# Patient Record
Sex: Female | Born: 1970 | Race: White | Hispanic: No | Marital: Married | Smoking: Never smoker
Health system: Southern US, Community
[De-identification: ages and names within clinical notes are randomized; demographics above are authoritative.]

## PROBLEM LIST (undated history)

## (undated) DIAGNOSIS — Z87442 Personal history of urinary calculi: Secondary | ICD-10-CM

## (undated) DIAGNOSIS — M1990 Unspecified osteoarthritis, unspecified site: Secondary | ICD-10-CM

## (undated) DIAGNOSIS — R7303 Prediabetes: Secondary | ICD-10-CM

## (undated) DIAGNOSIS — I10 Essential (primary) hypertension: Secondary | ICD-10-CM

## (undated) HISTORY — PX: PILONIDAL CYST EXCISION: SHX744

## (undated) HISTORY — PX: TUMOR EXCISION: SHX421

## (undated) HISTORY — PX: CHOLECYSTECTOMY: SHX55

## (undated) HISTORY — DX: Morbid (severe) obesity due to excess calories: E66.01

## (undated) HISTORY — DX: Prediabetes: R73.03

## (undated) HISTORY — PX: CYST EXCISION: SHX5701

---

## 2008-05-01 ENCOUNTER — Ambulatory Visit: Payer: Self-pay | Admitting: Family Medicine

## 2011-06-28 ENCOUNTER — Ambulatory Visit: Payer: Self-pay | Admitting: Internal Medicine

## 2019-03-11 ENCOUNTER — Encounter: Payer: Self-pay | Admitting: *Deleted

## 2019-03-11 ENCOUNTER — Other Ambulatory Visit: Payer: Self-pay

## 2019-03-11 ENCOUNTER — Encounter
Admission: RE | Admit: 2019-03-11 | Discharge: 2019-03-11 | Disposition: A | Discharge status: Home / Self Care | Payer: BLUE CROSS/BLUE SHIELD | Source: Ambulatory Visit | Attending: Orthopedic Surgery | Admitting: Orthopedic Surgery

## 2019-03-11 HISTORY — DX: Essential (primary) hypertension: I10

## 2019-03-11 HISTORY — DX: Unspecified osteoarthritis, unspecified site: M19.90

## 2019-03-11 HISTORY — DX: Personal history of urinary calculi: Z87.442

## 2019-03-11 NOTE — Patient Instructions (Signed)
Your procedure is scheduled on: 03-14-19 Report to Same Day Surgery 2nd floor medical mall [facility] Entrance-take elevator on left to 2nd floor.  Check in with surgery information desk.) To find out your arrival time please call [phone] between 1PM - 3PM on 03-11-19  Remember: Instructions that are not followed completely may result in serious medical risk, up to and including death, or upon the discretion of your surgeon and anesthesiologist your surgery may need to be rescheduled.    _x___ 1. Do not eat food after midnight the night before your procedure. You may drink clear liquids up to 2 hours before you are scheduled to arrive at the hospital for your procedure.  Do not drink clear liquids within 2 hours of your scheduled arrival to the hospital.  Clear liquids include  --Water or Apple juice without pulp  --Clear carbohydrate beverage such as ClearFast or Gatorade  --Black Coffee or Clear Tea (No milk, no creamers, do not add anything to the coffee or Tea   ____Ensure clear carbohydrate drink on the way to the hospital for bariatric patients  ____Ensure clear carbohydrate drink 3 hours before surgery for Dr Rutherford Nail patients if physician instructed.   No gum chewing or hard candies.     __x__ 2. No Alcohol for 24 hours before or after surgery.   __x__3. No Smoking or e-cigarettes for 24 prior to surgery.  Do not use any chewable tobacco products for at least 6 hour prior to surgery   ____  4. Bring all medications with you on the day of surgery if instructed.    __x__ 5. Notify your doctor if there is any change in your medical condition     (cold, fever, infections).    x___6. On the morning of surgery brush your teeth with toothpaste and water.  You may rinse your mouth with mouth wash if you wish.  Do not swallow any toothpaste or mouthwash.   Do not wear jewelry, make-up, hairpins, clips or nail polish.  Do not wear lotions, powders, or perfumes. You may wear  deodorant.  Do not shave 48 hours prior to surgery. Men may shave face and neck.  Do not bring valuables to the hospital.    [facility] is not responsible for any belongings or valuables.               Contacts, dentures or bridgework may not be worn into surgery.  Leave your suitcase in the car. After surgery it may be brought to your room.  For patients admitted to the hospital, discharge time is determined by your treatment team.  _  Patients discharged the day of surgery will not be allowed to drive home.  You will need someone to drive you home and stay with you the night of your procedure.    Please read over the following fact sheets that you were given:   [facility] Preparing for Surgery   ____ Take anti-hypertensive listed below, cardiac, seizure, asthma, anti-reflux and psychiatric medicines. These include:  1. NONE  2.  3.  4.  5.  6.  ____Fleets enema or Magnesium Citrate as directed.   _x___ Use CHG Soap or sage wipes as directed on instruction sheet   ____ Use inhalers on the day of surgery and bring to hospital day of surgery  ____ Stop Metformin and Janumet 2 days prior to surgery.    ____ Take 1/2 of usual insulin dose the night before surgery and none on  the morning surgery.   ____ Follow recommendations from Cardiologist, Pulmonologist or PCP regarding stopping Aspirin, Coumadin, Plavix ,Eliquis, Effient, or Pradaxa, and Pletal.  X____Stop Anti-inflammatories such as Advil, Aleve, Ibuprofen, Motrin, Naproxen, Naprosyn, Goodies powders or aspirin products NOW-OK to take Tylenol   ____ Stop supplements until after surgery.     ____ Bring C-Pap to the hospital.

## 2019-03-13 MED ORDER — DEXTROSE 5 % IV SOLN
3.0000 g | Freq: Once | INTRAVENOUS | Status: AC
  Administered 2019-03-14: 3 g via INTRAVENOUS
  Filled 2019-03-13: qty 3000

## 2019-03-14 ENCOUNTER — Ambulatory Visit: Payer: BLUE CROSS/BLUE SHIELD | Admitting: Certified Registered"

## 2019-03-14 ENCOUNTER — Encounter
Admission: RE | Disposition: A | Discharge status: Home / Self Care | Payer: Self-pay | Source: Home / Self Care | Attending: Orthopedic Surgery

## 2019-03-14 ENCOUNTER — Encounter: Payer: Self-pay | Admitting: *Deleted

## 2019-03-14 ENCOUNTER — Ambulatory Visit
Admission: RE | Admit: 2019-03-14 | Discharge: 2019-03-14 | Disposition: A | Discharge status: Home / Self Care | Payer: BLUE CROSS/BLUE SHIELD | Attending: Orthopedic Surgery | Admitting: Orthopedic Surgery

## 2019-03-14 ENCOUNTER — Other Ambulatory Visit: Payer: Self-pay

## 2019-03-14 DIAGNOSIS — M23204 Derangement of unspecified medial meniscus due to old tear or injury, left knee: Secondary | ICD-10-CM | POA: Insufficient documentation

## 2019-03-14 DIAGNOSIS — Z975 Presence of (intrauterine) contraceptive device: Secondary | ICD-10-CM | POA: Insufficient documentation

## 2019-03-14 DIAGNOSIS — I10 Essential (primary) hypertension: Secondary | ICD-10-CM | POA: Diagnosis not present

## 2019-03-14 DIAGNOSIS — M1712 Unilateral primary osteoarthritis, left knee: Secondary | ICD-10-CM | POA: Diagnosis not present

## 2019-03-14 DIAGNOSIS — Z6841 Body Mass Index (BMI) 40.0 and over, adult: Secondary | ICD-10-CM | POA: Diagnosis not present

## 2019-03-14 DIAGNOSIS — Z79899 Other long term (current) drug therapy: Secondary | ICD-10-CM | POA: Diagnosis not present

## 2019-03-14 DIAGNOSIS — Z888 Allergy status to other drugs, medicaments and biological substances status: Secondary | ICD-10-CM | POA: Diagnosis not present

## 2019-03-14 HISTORY — PX: KNEE ARTHROSCOPY WITH MEDIAL MENISECTOMY: SHX5651

## 2019-03-14 LAB — BASIC METABOLIC PANEL
Anion gap: 12 (ref 5–15)
BUN: 14 mg/dL (ref 6–20)
CO2: 24 mmol/L (ref 22–32)
Calcium: 9.3 mg/dL (ref 8.9–10.3)
Chloride: 100 mmol/L (ref 98–111)
Creatinine, Ser: 0.49 mg/dL (ref 0.44–1.00)
GFR calc Af Amer: >60 mL/min (ref >60)
GFR calc non Af Amer: >60 mL/min (ref >60)
Glucose, Bld: 111 mg/dL — ABNORMAL HIGH (ref 70–99)
Potassium: 3.6 mmol/L (ref 3.5–5.1)
Sodium: 136 mmol/L (ref 135–145)

## 2019-03-14 LAB — POCT PREGNANCY, URINE: Preg Test, Ur: NEGATIVE

## 2019-03-14 SURGERY — ARTHROSCOPY, KNEE, WITH MEDIAL MENISCECTOMY
Anesthesia: General | Laterality: Left

## 2019-03-14 MED ORDER — FAMOTIDINE 20 MG PO TABS
20.0000 mg | ORAL_TABLET | Freq: Once | ORAL | Status: AC
  Administered 2019-03-14: 20 mg via ORAL

## 2019-03-14 MED ORDER — FENTANYL CITRATE (PF) 100 MCG/2ML IJ SOLN
INTRAMUSCULAR | Status: AC
  Administered 2019-03-14: 50 ug via INTRAVENOUS
  Filled 2019-03-14: qty 2

## 2019-03-14 MED ORDER — PROMETHAZINE HCL 25 MG/ML IJ SOLN: 6.2500 mg | INTRAMUSCULAR | Status: DC | PRN

## 2019-03-14 MED ORDER — EPINEPHRINE 30 MG/30ML IJ SOLN
INTRAMUSCULAR | Status: AC
  Filled 2019-03-14: qty 1

## 2019-03-14 MED ORDER — ACETAMINOPHEN 500 MG PO TABS: 1000.0000 mg | ORAL_TABLET | Freq: Three times a day (TID) | ORAL | 2 refills | Status: AC

## 2019-03-14 MED ORDER — ONDANSETRON HCL 4 MG/2ML IJ SOLN
INTRAMUSCULAR | Status: DC | PRN
  Administered 2019-03-14: 4 mg via INTRAVENOUS

## 2019-03-14 MED ORDER — ROCURONIUM BROMIDE 50 MG/5ML IV SOLN
INTRAVENOUS | Status: AC
  Filled 2019-03-14: qty 1

## 2019-03-14 MED ORDER — OXYCODONE HCL 5 MG PO TABS
ORAL_TABLET | ORAL | Status: AC
  Administered 2019-03-14: 5 mg via ORAL
  Filled 2019-03-14: qty 1

## 2019-03-14 MED ORDER — ASPIRIN EC 325 MG PO TBEC: 325.0000 mg | DELAYED_RELEASE_TABLET | Freq: Every day | ORAL | 0 refills | Status: AC

## 2019-03-14 MED ORDER — MIDAZOLAM HCL 2 MG/2ML IJ SOLN
INTRAMUSCULAR | Status: DC | PRN
  Administered 2019-03-14: 2 mg via INTRAVENOUS

## 2019-03-14 MED ORDER — FENTANYL CITRATE (PF) 250 MCG/5ML IJ SOLN
INTRAMUSCULAR | Status: AC
  Filled 2019-03-14: qty 5

## 2019-03-14 MED ORDER — DEXAMETHASONE SODIUM PHOSPHATE 10 MG/ML IJ SOLN
INTRAMUSCULAR | Status: DC | PRN
  Administered 2019-03-14: 10 mg via INTRAVENOUS

## 2019-03-14 MED ORDER — HYDROCODONE-ACETAMINOPHEN 5-325 MG PO TABS: 1.0000 | ORAL_TABLET | ORAL | 0 refills | Status: DC | PRN

## 2019-03-14 MED ORDER — PROPOFOL 10 MG/ML IV BOLUS
INTRAVENOUS | Status: AC
  Filled 2019-03-14: qty 40

## 2019-03-14 MED ORDER — LIDOCAINE-EPINEPHRINE (PF) 1 %-1:200000 IJ SOLN
INTRAMUSCULAR | Status: AC
  Filled 2019-03-14: qty 30

## 2019-03-14 MED ORDER — FENTANYL CITRATE (PF) 100 MCG/2ML IJ SOLN
INTRAMUSCULAR | Status: DC | PRN
  Administered 2019-03-14 (×3): 50 ug via INTRAVENOUS
  Administered 2019-03-14: 100 ug via INTRAVENOUS

## 2019-03-14 MED ORDER — FENTANYL CITRATE (PF) 100 MCG/2ML IJ SOLN
INTRAMUSCULAR | Status: AC
  Administered 2019-03-14: 25 ug via INTRAVENOUS
  Filled 2019-03-14: qty 2

## 2019-03-14 MED ORDER — FENTANYL CITRATE (PF) 100 MCG/2ML IJ SOLN
25.0000 ug | INTRAMUSCULAR | Status: DC | PRN
  Administered 2019-03-14 (×2): 25 ug via INTRAVENOUS
  Administered 2019-03-14: 50 ug via INTRAVENOUS
  Administered 2019-03-14 (×2): 25 ug via INTRAVENOUS

## 2019-03-14 MED ORDER — OXYCODONE HCL 5 MG/5ML PO SOLN: 5.0000 mg | Freq: Once | ORAL | Status: AC | PRN

## 2019-03-14 MED ORDER — ONDANSETRON HCL 4 MG/2ML IJ SOLN
INTRAMUSCULAR | Status: AC
  Filled 2019-03-14: qty 2

## 2019-03-14 MED ORDER — BUPIVACAINE HCL (PF) 0.5 % IJ SOLN
INTRAMUSCULAR | Status: AC
  Filled 2019-03-14: qty 30

## 2019-03-14 MED ORDER — DEXAMETHASONE SODIUM PHOSPHATE 10 MG/ML IJ SOLN
INTRAMUSCULAR | Status: AC
  Filled 2019-03-14: qty 1

## 2019-03-14 MED ORDER — IBUPROFEN 800 MG PO TABS: 800.0000 mg | ORAL_TABLET | Freq: Three times a day (TID) | ORAL | 0 refills | Status: AC

## 2019-03-14 MED ORDER — DEXMEDETOMIDINE HCL 200 MCG/2ML IV SOLN
INTRAVENOUS | Status: DC | PRN
  Administered 2019-03-14: 12 ug via INTRAVENOUS
  Administered 2019-03-14: 8 ug via INTRAVENOUS

## 2019-03-14 MED ORDER — LIDOCAINE HCL (PF) 2 % IJ SOLN
INTRAMUSCULAR | Status: AC
  Filled 2019-03-14: qty 10

## 2019-03-14 MED ORDER — PROPOFOL 10 MG/ML IV BOLUS
INTRAVENOUS | Status: DC | PRN
  Administered 2019-03-14: 200 mg via INTRAVENOUS

## 2019-03-14 MED ORDER — LACTATED RINGERS IV SOLN
INTRAVENOUS | Status: DC | PRN
  Administered 2019-03-14: 6000 mL

## 2019-03-14 MED ORDER — OXYCODONE HCL 5 MG PO TABS
5.0000 mg | ORAL_TABLET | Freq: Once | ORAL | Status: AC | PRN
  Administered 2019-03-14: 5 mg via ORAL

## 2019-03-14 MED ORDER — SUCCINYLCHOLINE CHLORIDE 20 MG/ML IJ SOLN
INTRAMUSCULAR | Status: AC
  Filled 2019-03-14: qty 1

## 2019-03-14 MED ORDER — LACTATED RINGERS IV SOLN
INTRAVENOUS | Status: DC
  Administered 2019-03-14 (×2): via INTRAVENOUS

## 2019-03-14 MED ORDER — SUCCINYLCHOLINE CHLORIDE 20 MG/ML IJ SOLN
INTRAMUSCULAR | Status: DC | PRN
  Administered 2019-03-14: 180 mg via INTRAVENOUS

## 2019-03-14 MED ORDER — LIDOCAINE HCL (CARDIAC) PF 100 MG/5ML IV SOSY
PREFILLED_SYRINGE | INTRAVENOUS | Status: DC | PRN
  Administered 2019-03-14: 100 mg via INTRAVENOUS

## 2019-03-14 MED ORDER — MEPERIDINE HCL 50 MG/ML IJ SOLN: 6.2500 mg | INTRAMUSCULAR | Status: DC | PRN

## 2019-03-14 MED ORDER — SUGAMMADEX SODIUM 500 MG/5ML IV SOLN
INTRAVENOUS | Status: DC | PRN
  Administered 2019-03-14: 400 mg via INTRAVENOUS

## 2019-03-14 MED ORDER — ROCURONIUM BROMIDE 100 MG/10ML IV SOLN
INTRAVENOUS | Status: DC | PRN
  Administered 2019-03-14: 10 mg via INTRAVENOUS
  Administered 2019-03-14: 40 mg via INTRAVENOUS

## 2019-03-14 MED ORDER — SUGAMMADEX SODIUM 500 MG/5ML IV SOLN
INTRAVENOUS | Status: AC
  Filled 2019-03-14: qty 5

## 2019-03-14 MED ORDER — FAMOTIDINE 20 MG PO TABS
ORAL_TABLET | ORAL | Status: AC
  Filled 2019-03-14: qty 1

## 2019-03-14 MED ORDER — ONDANSETRON 4 MG PO TBDP: 4.0000 mg | ORAL_TABLET | Freq: Three times a day (TID) | ORAL | 0 refills | Status: DC | PRN

## 2019-03-14 MED ORDER — MIDAZOLAM HCL 2 MG/2ML IJ SOLN
INTRAMUSCULAR | Status: AC
  Filled 2019-03-14: qty 2

## 2019-03-14 SURGICAL SUPPLY — 56 items
ADAPTER IRRIG TUBE 2 SPIKE SOL (ADAPTER) ×6 IMPLANT
BANDAGE ACE 6X5 VEL STRL LF (GAUZE/BANDAGES/DRESSINGS) ×3 IMPLANT
BLADE SURG SZ11 CARB STEEL (BLADE) ×3 IMPLANT
BNDG COHESIVE 6X5 TAN STRL LF (GAUZE/BANDAGES/DRESSINGS) ×3 IMPLANT
BNDG ESMARK 6X12 TAN STRL LF (GAUZE/BANDAGES/DRESSINGS) ×3 IMPLANT
BUR RADIUS 3.5 (BURR) ×3 IMPLANT
BUR RADIUS 4.0X18.5 (BURR) ×3 IMPLANT
CAST PADDING 6X4YD ST 30248 (SOFTGOODS) ×2
CHLORAPREP W/TINT 26 (MISCELLANEOUS) ×3 IMPLANT
CLOSURE WOUND 1/2 X4 (GAUZE/BANDAGES/DRESSINGS)
COOLER POLAR GLACIER W/PUMP (MISCELLANEOUS) ×3 IMPLANT
COVER WAND RF STERILE (DRAPES) ×3 IMPLANT
CUFF TOURN SGL QUICK 24 (TOURNIQUET CUFF)
CUFF TOURN SGL QUICK 30 (TOURNIQUET CUFF)
CUFF TRNQT CYL 24X4X16.5-23 (TOURNIQUET CUFF) IMPLANT
CUFF TRNQT CYL 30X4X21-28X (TOURNIQUET CUFF) IMPLANT
DEVICE SUCT BLK HOLE OR FLOOR (MISCELLANEOUS) ×3 IMPLANT
DRAPE IMP U-DRAPE 54X76 (DRAPES) ×3 IMPLANT
DRAPE LEGGINS SURG 28X43 STRL (DRAPES) IMPLANT
ELECT REM PT RETURN 9FT ADLT (ELECTROSURGICAL)
ELECTRODE REM PT RTRN 9FT ADLT (ELECTROSURGICAL) IMPLANT
GAUZE SPONGE 4X4 12PLY STRL (GAUZE/BANDAGES/DRESSINGS) ×3 IMPLANT
GLOVE BIOGEL PI IND STRL 8 (GLOVE) ×1 IMPLANT
GLOVE BIOGEL PI INDICATOR 8 (GLOVE) ×2
GLOVE SURG ORTHO 8.0 STRL STRW (GLOVE) ×6 IMPLANT
GOWN STRL REUS W/ TWL LRG LVL3 (GOWN DISPOSABLE) ×1 IMPLANT
GOWN STRL REUS W/ TWL XL LVL3 (GOWN DISPOSABLE) ×1 IMPLANT
GOWN STRL REUS W/TWL LRG LVL3 (GOWN DISPOSABLE) ×2
GOWN STRL REUS W/TWL XL LVL3 (GOWN DISPOSABLE) ×2
IV LACTATED RINGER IRRG 3000ML (IV SOLUTION) ×8
IV LR IRRIG 3000ML ARTHROMATIC (IV SOLUTION) ×4 IMPLANT
KIT TURNOVER KIT A (KITS) ×3 IMPLANT
MANIFOLD NEPTUNE II (INSTRUMENTS) ×3 IMPLANT
MAT ABSORB  FLUID 56X50 GRAY (MISCELLANEOUS) ×4
MAT ABSORB FLUID 56X50 GRAY (MISCELLANEOUS) ×2 IMPLANT
NDL MAYO CATGUT SZ5 (NEEDLE)
NDL SUT 5 .5 CRC TPR PNT MAYO (NEEDLE) IMPLANT
NEEDLE HYPO 22GX1.5 SAFETY (NEEDLE) ×3 IMPLANT
PACK ARTHROSCOPY KNEE (MISCELLANEOUS) ×3 IMPLANT
PAD ABD DERMACEA PRESS 5X9 (GAUZE/BANDAGES/DRESSINGS) ×6 IMPLANT
PAD WRAPON POLAR KNEE (MISCELLANEOUS) ×1 IMPLANT
PADDING CAST COTTON 6X4 ST (SOFTGOODS) ×1 IMPLANT
PENCIL ELECTRO HAND CTR (MISCELLANEOUS) IMPLANT
SET TUBE SUCT SHAVER OUTFL 24K (TUBING) ×3 IMPLANT
SET TUBE TIP INTRA-ARTICULAR (MISCELLANEOUS) ×3 IMPLANT
STRIP CLOSURE SKIN 1/2X4 (GAUZE/BANDAGES/DRESSINGS) IMPLANT
SUT ETHILON 3-0 FS-10 30 BLK (SUTURE) ×3
SUT MNCRL AB 4-0 PS2 18 (SUTURE) IMPLANT
SUT VIC AB 0 CT2 27 (SUTURE) IMPLANT
SUT VIC AB 2-0 CT2 27 (SUTURE) IMPLANT
SUTURE EHLN 3-0 FS-10 30 BLK (SUTURE) ×1 IMPLANT
TOWEL OR 17X26 4PK STRL BLUE (TOWEL DISPOSABLE) ×6 IMPLANT
TUBING ARTHRO INFLOW-ONLY STRL (TUBING) ×3 IMPLANT
WAND HAND CNTRL MULTIVAC 50 (MISCELLANEOUS) IMPLANT
WAND WEREWOLF FLOW 90D (MISCELLANEOUS) ×3 IMPLANT
WRAPON POLAR PAD KNEE (MISCELLANEOUS) ×3

## 2019-03-14 NOTE — Transfer of Care (Signed)
Immediate Anesthesia Transfer of Care Note  Patient: Sierra Mitchell  Procedure(s) Performed: KNEE ARTHROSCOPY WITH PATIAL MEDIAL MENISECTOMY  LEFT (Left )  Patient Location: PACU  Anesthesia Type:General  Level of Consciousness: drowsy  Airway & Oxygen Therapy: Patient Spontanous Breathing and Patient connected to nasal cannula oxygen  Post-op Assessment: Report given to RN and Post -op Vital signs reviewed and stable  Post vital signs: stable  Last Vitals:  Vitals Value Taken Time  BP 175/106 03/14/2019  2:34 PM  Temp 36.4 C 03/14/2019  2:34 PM  Pulse 111 03/14/2019  2:39 PM  Resp 15 03/14/2019  2:39 PM  SpO2 98 % 03/14/2019  2:39 PM  Vitals shown include unvalidated device data.  Last Pain:  Vitals:   03/14/19 1016  TempSrc: Tympanic  PainSc: 6          Complications: No apparent anesthesia complications

## 2019-03-14 NOTE — Anesthesia Preprocedure Evaluation (Signed)
Anesthesia Evaluation  Patient identified by MRN, date of birth, ID band Patient awake    Reviewed: Allergy & Precautions, NPO status , Patient's Chart, lab work & pertinent test results  History of Anesthesia Complications Negative for: history of anesthetic complications  Airway Mallampati: III  TM Distance: >3 FB Neck ROM: Full    Dental no notable dental hx.    Pulmonary neg pulmonary ROS, neg sleep apnea, neg COPD,    breath sounds clear to auscultation- rhonchi (-) wheezing      Cardiovascular hypertension, Pt. on medications (-) CAD, (-) Past MI, (-) Cardiac Stents and (-) CABG  Rhythm:Regular Rate:Normal - Systolic murmurs and - Diastolic murmurs    Neuro/Psych neg Seizures negative neurological ROS  negative psych ROS   GI/Hepatic negative GI ROS, Neg liver ROS,   Endo/Other  negative endocrine ROSneg diabetes  Renal/GU negative Renal ROS     Musculoskeletal  (+) Arthritis ,   Abdominal (+) + obese,   Peds  Hematology negative hematology ROS (+)   Anesthesia Other Findings Past Medical History: No date: Arthritis No date: History of kidney stones     Comment:  h/o No date: Hypertension   Reproductive/Obstetrics                             Anesthesia Physical Anesthesia Plan  ASA: III  Anesthesia Plan: General   Post-op Pain Management:    Induction: Intravenous  PONV Risk Score and Plan: 2 and Ondansetron and Midazolam  Airway Management Planned: Oral ETT  Additional Equipment:   Intra-op Plan:   Post-operative Plan: Extubation in OR  Informed Consent: I have reviewed the patients History and Physical, chart, labs and discussed the procedure including the risks, benefits and alternatives for the proposed anesthesia with the patient or authorized representative who has indicated his/her understanding and acceptance.     Dental advisory given  Plan Discussed  with: CRNA and Anesthesiologist  Anesthesia Plan Comments:         Anesthesia Quick Evaluation

## 2019-03-14 NOTE — Anesthesia Postprocedure Evaluation (Signed)
Anesthesia Post Note  Patient: Sierra Mitchell  Procedure(s) Performed: KNEE ARTHROSCOPY WITH PATIAL MEDIAL MENISECTOMY  LEFT (Left )  Patient location during evaluation: PACU Anesthesia Type: General Level of consciousness: awake and alert and oriented Pain management: pain level controlled Vital Signs Assessment: post-procedure vital signs reviewed and stable Respiratory status: spontaneous breathing, nonlabored ventilation and respiratory function stable Cardiovascular status: blood pressure returned to baseline and stable Postop Assessment: no signs of nausea or vomiting Anesthetic complications: no     Last Vitals:  Vitals:   03/14/19 1445 03/14/19 1448  BP:  (!) 148/87  Pulse: (!) 106   Resp: 15   Temp:    SpO2: 92% 97%    Last Pain:  Vitals:   03/14/19 1448  TempSrc:   PainSc: 5                  Nakaiya Beddow

## 2019-03-14 NOTE — Anesthesia Post-op Follow-up Note (Signed)
Anesthesia QCDR form completed.        

## 2019-03-14 NOTE — Op Note (Addendum)
Operative Note    SURGERY DATE: 03/14/2019   PRE-OP DIAGNOSIS:  1. Left medial meniscus tear   POST-OP DIAGNOSIS:  1. Left medial meniscus tear 2. Left medial femoral condyle degenerative changes   PROCEDURES:  1. Left knee arthroscopy, partial medial meniscectomy   SURGEON: Rosealee Albee, MD   ANESTHESIA: Gen   ESTIMATED BLOOD LOSS: minimal   TOTAL IV FLUIDS: per anesthesia   INDICATION(S):  Sierra Mitchell is a 48 y.o. female with signs and symptoms as well as MRI finding of medial meniscus tear. We had a lengthy conversation regarding her weight and morbid obesity, and that she was at significantly higher perioperative risk as well as postoperative difficulty. After discussion of risks, benefits, and alternatives to surgery, the patient elected to proceed.   OPERATIVE FINDINGS:    Examination under anesthesia: A careful examination under anesthesia was performed.  Passive range of motion was: Hyperextension: 1.  Extension: 0.  Flexion: 110.  Lachman: normal. Pivot Shift: normal.  Posterior drawer: normal.  Varus stability in full extension: normal.  Varus stability in 30 degrees of flexion: normal.  Valgus stability in full extension: normal.  Valgus stability in 30 degrees of flexion: normal.   Intra-operative findings: A thorough arthroscopic examination of the knee was performed.  The findings are: 1. Suprapatellar pouch: Normal 2. Undersurface of median ridge: Grade 1 softening 3. Medial patellar facet: Grade 1 degenerative changes 4. Lateral patellar facet: Grade 1 degenerative changes 5. Trochlea: Normal 6. Lateral gutter/popliteus tendon: Normal 7. Hoffa's fat pad: Inflamed 8. Medial gutter/plica: Normal 9. ACL: Normal 10. PCL: Normal 11. Medial meniscus: Radial/parrot beak type tear of the posterior horn of the medial meniscus affecting ~40% of meniscus width.  12. Medial compartment cartilage: Diffuse Grade 1-2 degenerative changes 13. Lateral meniscus: Normal 14.  Lateral compartment cartilage: Normal   OPERATIVE REPORT:     I identified Dorris Riehle in the pre-operative holding area. I marked the operative knee with my initials. I reviewed the risks and benefits of the proposed surgical intervention and the patient (and/or patient's guardian) wished to proceed. The patient was transferred to the operative suite and placed in the supine position with all bony prominences padded.  Anesthesia was administered. Appropriate IV antibiotics were administered prior to incision. The extremity was then prepped and draped in standard fashion. A time out was performed confirming the correct extremity, correct patient, and correct procedure.   Arthroscopy portals were marked. Local anesthetic was injected to the planned portal sites. The anterolateral portal was established with an 11 blade.      The arthroscope was placed in the anterolateral portal and then into the suprapatellar pouch.  A diagnostic knee scope was completed with the above findings. The medial meniscus tear was identified.   Next the medial portal was established under needle localization. The MCL was pie-crusted to improve visualization of the posterior horn. The meniscal tear was debrided using an arthroscopic biter and an oscillating shaver until the meniscus had stable borders.  Arthroscopic fluid was removed from the joint.   The portals were closed with 3-0 Nylon suture. Sterile dressings included Xeroform, 4x4s, Sof-Rol, and Bias wrap. A Polarcare was placed.  The patient was then awakened and taken to the PACU hemodynamically stable without complication.   Of note, this case required increased surgical difficulty and complexity.  This was primarily due to the patient's morbid obesity with a BMI of 67.  This increased surgical time due to difficulty with identifying appropriate bony  landmarks and creating normal arthroscopic portals.  Additionally, additional assistance was needed from the scrub  tech with positioning of the leg due to the patient's weight.   POSTOPERATIVE PLAN: The patient will be discharged home today once they meet PACU criteria. Aspirin 325 mg daily was prescribed for 2 weeks for DVT prophylaxis.  Physical therapy will start on POD#3-4. Weight-bearing as tolerated. Follow up in 2 weeks per protocol.

## 2019-03-14 NOTE — H&P (Signed)
Paper H&P to be scanned into permanent record. H&P reviewed. No significant changes noted.  

## 2019-03-14 NOTE — Anesthesia Procedure Notes (Signed)
Procedure Name: Intubation Date/Time: 03/14/2019 1:14 PM Performed by: Oletta Lamas, CRNA Pre-anesthesia Checklist: Patient identified, Emergency Drugs available, Suction available, Patient being monitored and Timeout performed Patient Re-evaluated:Patient Re-evaluated prior to induction Oxygen Delivery Method: Circle system utilized Preoxygenation: Pre-oxygenation with 100% oxygen Induction Type: IV induction Ventilation: Mask ventilation without difficulty Laryngoscope Size: Glidescope and 3 Grade View: Grade I Tube type: Oral Tube size: 7.5 mm Number of attempts: 1 Airway Equipment and Method: Patient positioned with wedge pillow and Stylet Placement Confirmation: ETT inserted through vocal cords under direct vision,  positive ETCO2 and breath sounds checked- equal and bilateral Secured at: 21 (Atv teeth) cm Tube secured with: Tape Dental Injury: Teeth and Oropharynx as per pre-operative assessment

## 2019-03-14 NOTE — Discharge Instructions (Addendum)
Arthroscopic Knee Surgery - Partial Meniscectomy   Post-Op Instructions   1. Bracing or crutches: Crutches will be provided at the time of discharge from the surgery center if you do not already have them.   2. Ice: You may be provided with a device (Polar Care) that allows you to ice the affected area effectively. Otherwise you can ice manually.    3. Driving:  Plan on not driving for at least two weeks. Please note that you are advised NOT to drive while taking narcotic pain medications as you may be impaired and unsafe to drive.   4. Activity: Ankle pumps several times an hour while awake to prevent blood clots. Weight bearing: as tolerated. Use crutches for as needed (usually ~1 week or less) until pain allows you to ambulate without a limp. Bending and straightening the knee is unlimited. Elevate knee above heart level as much as possible for one week. Avoid standing more than 5 minutes (consecutively) for the first week.  Avoid long distance travel for 2 weeks.  5. Medications:  - You have been provided a prescription for narcotic pain medicine. After surgery, take 1-2 narcotic tablets every 4 hours if needed for severe pain.  - You may take up to 3000mg/day of tylenol (acetaminophen). You can take 1000mg 3x/day. Please check your narcotic. If you have acetaminophen in your narcotic (each tablet will be 325mg), be careful not to exceed a total of 3000mg/day of acetaminophen.  - A prescription for anti-nausea medication will be provided in case the narcotic medicine or anesthesia causes nausea - take 1 tablet every 6 hours only if nauseated.  - Take ibuprofen 800 mg every 8 hours WITH food to reduce post-operative knee swelling. DO NOT STOP IBUPROFEN POST-OP UNTIL INSTRUCTED TO DO SO at first post-op office visit (10-14 days after surgery). However, please discontinue if you have any abdominal discomfort after taking this.  - Take enteric coated aspirin 325 mg once daily for 2 weeks to prevent  blood clots.    6. Bandages: The physical therapist should change the bandages at the first post-op appointment. If needed, the dressing supplies have been provided to you.   7. Physical Therapy: 1-2 times per week for 6 weeks. Therapy typically starts on post operative Day 3 or 4. You have been provided an order for physical therapy. The therapist will provide home exercises.   8. Work: May return to full work usually around 2 weeks after 1st post-operative visit. May do light duty/desk job in approximately 1-2 weeks when off of narcotics, pain is well-controlled, and swelling has decreased. Labor intensive jobs may require 4-6 weeks to return.      9. Post-Op Appointments: Your first post-op appointment will be with Dr. Patel in approximately 2 weeks time.    If you find that they have not been scheduled please call the Orthopaedic Appointment front desk at [phone].  AMBULATORY SURGERY  DISCHARGE INSTRUCTIONS   1) The drugs that you were given will stay in your system until tomorrow so for the next 24 hours you should not:  A) Drive an automobile B) Make any legal decisions C) Drink any alcoholic beverage   2) You may resume regular meals tomorrow.  Today it is better to start with liquids and gradually work up to solid foods.  You may eat anything you prefer, but it is better to start with liquids, then soup and crackers, and gradually work up to solid foods.   3) Please notify your   doctor immediately if you have any unusual bleeding, trouble breathing, redness and pain at the surgery site, drainage, fever, or pain not relieved by medication.    4) Additional Instructions:  AMBULATORY SURGERY  DISCHARGE INSTRUCTIONS   5) The drugs that you were given will stay in your system until tomorrow so for the next 24 hours you should not:  D) Drive an automobile E) Make any legal decisions F) Drink any alcoholic beverage   6) You may resume regular meals tomorrow.   Today it is better to start with liquids and gradually work up to solid foods.  You may eat anything you prefer, but it is better to start with liquids, then soup and crackers, and gradually work up to solid foods.   7) Please notify your doctor immediately if you have any unusual bleeding, trouble breathing, redness and pain at the surgery site, drainage, fever, or pain not relieved by medication.    8) Additional Instructions:    Please contact your physician with any problems or Same Day Surgery at [phone], Monday through Friday 6 am to 4 pm, or [place] at [facility] number at [phone]. Please contact your physician with any problems or Same Day Surgery at [phone], Monday through Friday 6 am to 4 pm, or [place] at [facility] number at [phone].

## 2019-03-15 ENCOUNTER — Encounter: Payer: Self-pay | Admitting: Orthopedic Surgery

## 2020-08-07 ENCOUNTER — Other Ambulatory Visit: Payer: Self-pay | Admitting: Orthopedic Surgery

## 2020-08-07 DIAGNOSIS — M1711 Unilateral primary osteoarthritis, right knee: Secondary | ICD-10-CM

## 2020-08-07 DIAGNOSIS — M2391 Unspecified internal derangement of right knee: Secondary | ICD-10-CM

## 2020-08-07 DIAGNOSIS — M25561 Pain in right knee: Secondary | ICD-10-CM

## 2020-08-28 ENCOUNTER — Other Ambulatory Visit: Payer: Self-pay | Admitting: Orthopedic Surgery

## 2020-08-31 ENCOUNTER — Ambulatory Visit
Admission: RE | Admit: 2020-08-31 | Discharge: 2020-08-31 | Disposition: A | Discharge status: Home / Self Care | Payer: Commercial Managed Care - PPO | Source: Ambulatory Visit | Attending: Orthopedic Surgery | Admitting: Orthopedic Surgery

## 2020-08-31 DIAGNOSIS — M25561 Pain in right knee: Secondary | ICD-10-CM

## 2020-08-31 DIAGNOSIS — M2391 Unspecified internal derangement of right knee: Secondary | ICD-10-CM

## 2020-08-31 DIAGNOSIS — M1711 Unilateral primary osteoarthritis, right knee: Secondary | ICD-10-CM

## 2022-03-01 IMAGING — MR MR KNEE*R* W/O CM
6 series · 40 of 40 positions shown · non-contrast
Comparison: None.

CLINICAL DATA: Acute right knee pain. Osteoarthritis. History of
left knee arthroscopy and partial meniscectomy.

EXAM:
MRI OF THE RIGHT KNEE WITHOUT CONTRAST
TECHNIQUE: Multiplanar, multisequence MR imaging of the knee was performed. No
intravenous contrast was administered.

[Series 6: T2 fat-sat · axial · right · 4.0mm · 0.50mm/px · z∈[-59,+95]mm · 9 of 36 slices shown (1 of 3)]
[im 1/36]
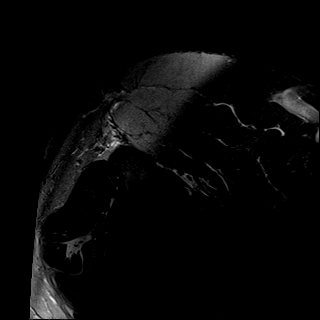
[im 5/36]
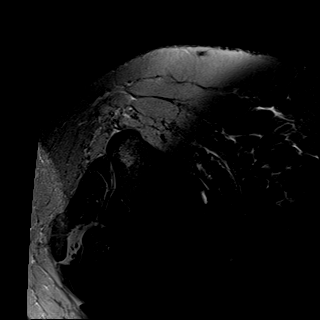
[im 9/36]
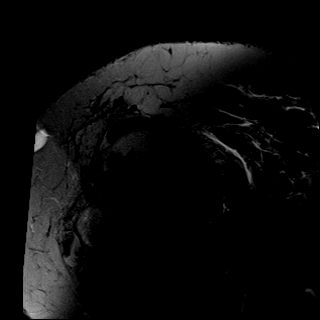
[im 14/36]
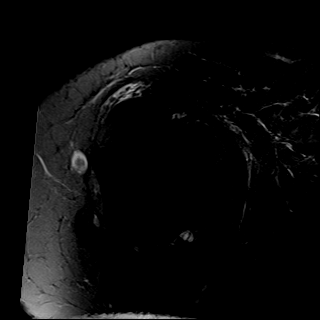
[im 18/36]
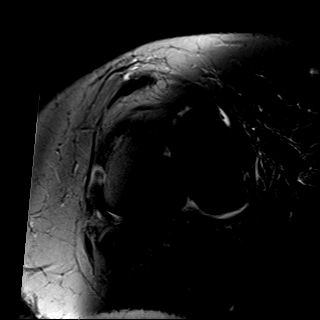
[im 22/36]
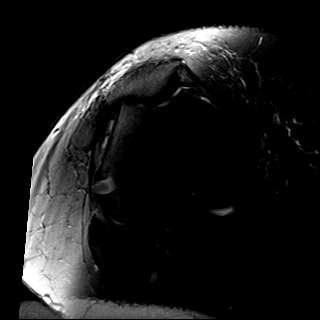
[im 27/36]
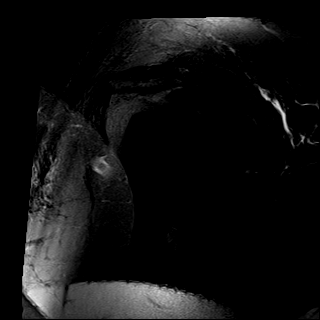
[im 31/36]
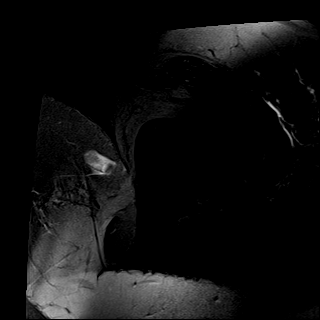
[im 36/36]
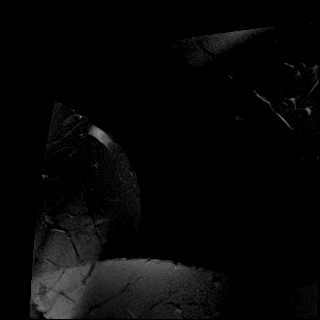

[Series 10: T1 · coronal · right · 4.0mm · 0.39mm/px · 7 of 28 slices shown]
[im 1/28]
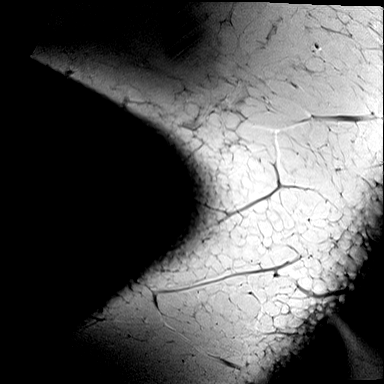
[im 5/28]
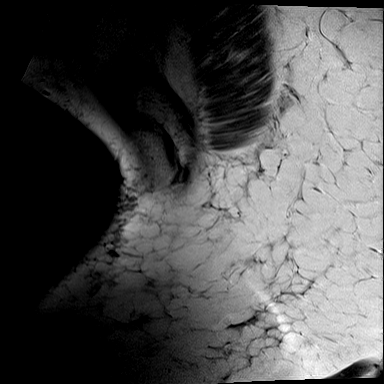
[im 10/28]
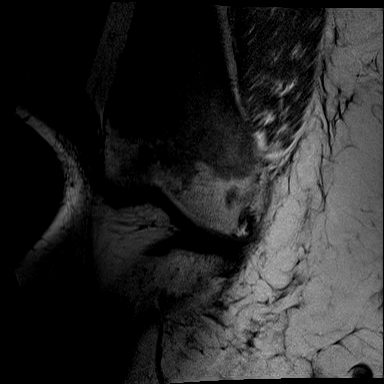
[im 14/28]
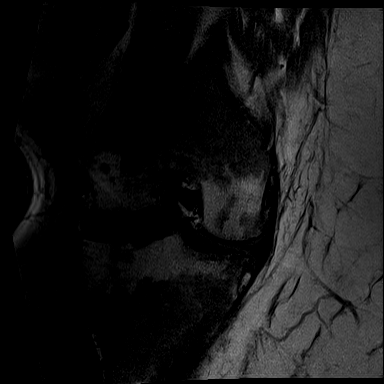
[im 19/28]
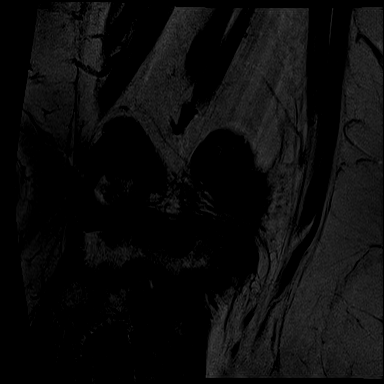
[im 23/28]
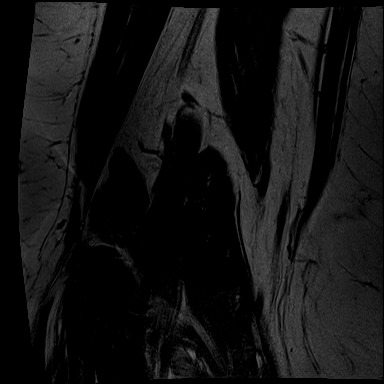
[im 28/28]
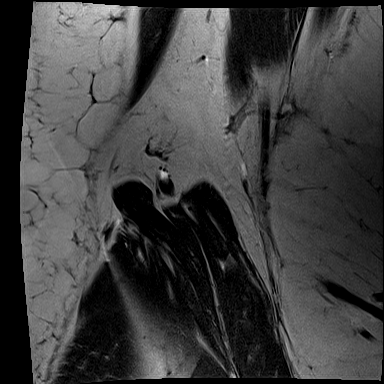

[Series 11: PD fat-sat · coronal · right · 3.0mm · 0.47mm/px · 6 of 28 slices shown (1 of 2)]
[im 1/28]
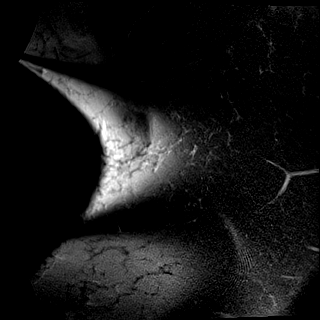
[im 6/28]
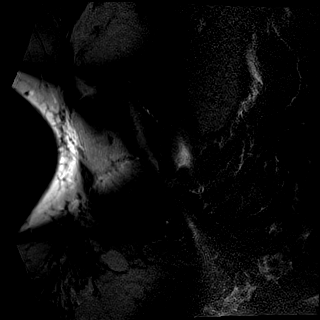
[im 11/28]
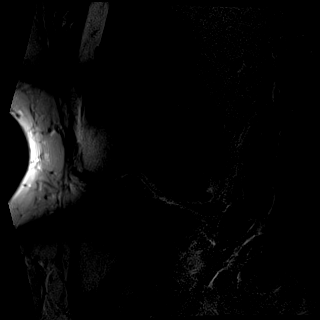
[im 17/28]
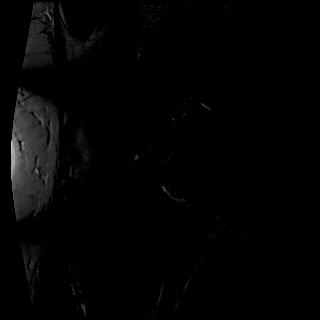
[im 22/28]
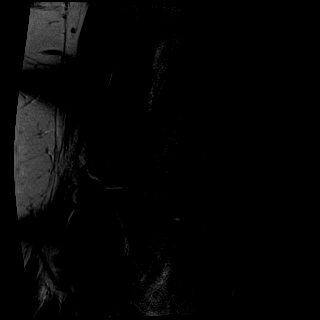
[im 28/28]
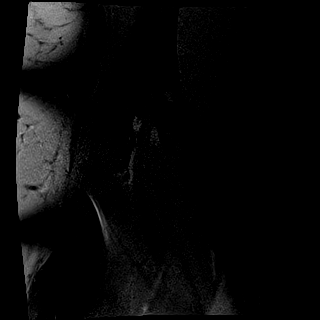

[Series 12: PD fat-sat · sagittal · right · 3.0mm · 0.59mm/px · 6 of 28 slices shown (2 of 2)]
[im 1/28]
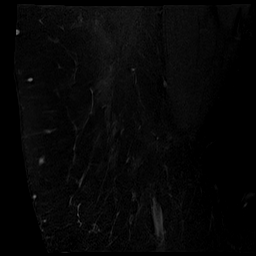
[im 6/28]
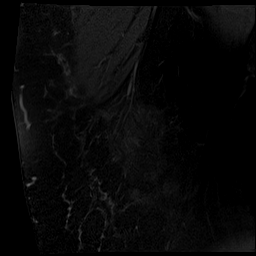
[im 11/28]
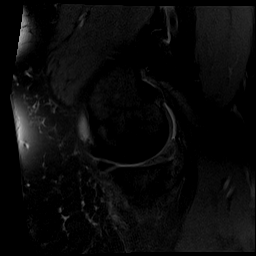
[im 17/28]
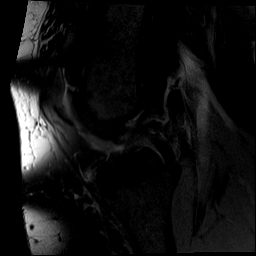
[im 22/28]
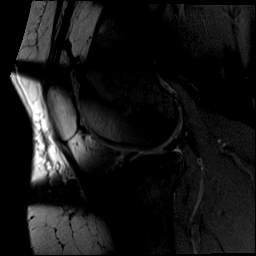
[im 28/28]
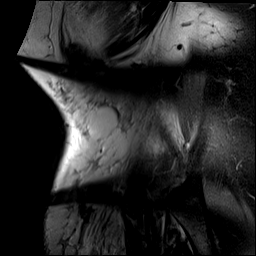

[Series 13: T2 fat-sat · sagittal · right · 3.0mm · 0.59mm/px · 6 of 28 slices shown (2 of 3)]
[im 1/28]
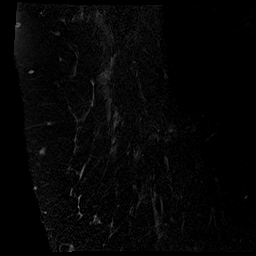
[im 6/28]
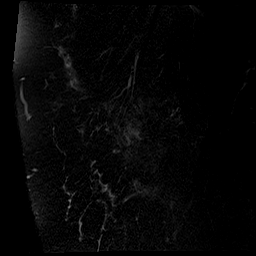
[im 11/28]
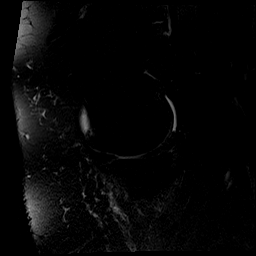
[im 17/28]
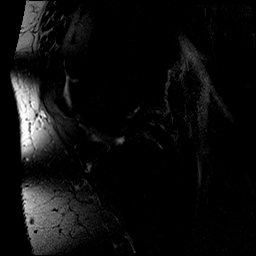
[im 22/28]
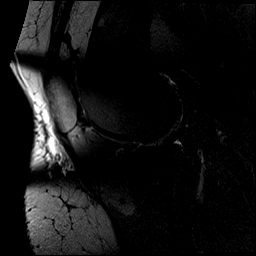
[im 28/28]
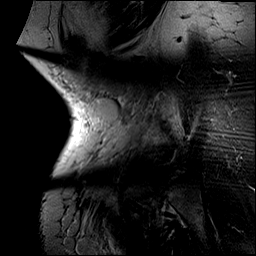

[Series 14: T2 fat-sat · coronal · right · 4.0mm · 0.39mm/px · 6 of 28 slices shown (3 of 3)]
[im 1/28]
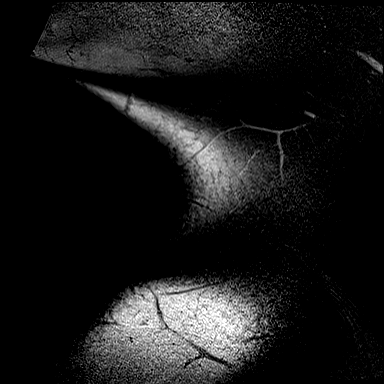
[im 6/28]
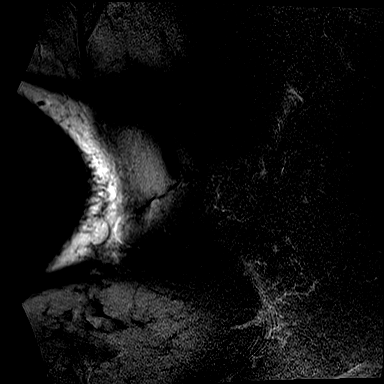
[im 11/28]
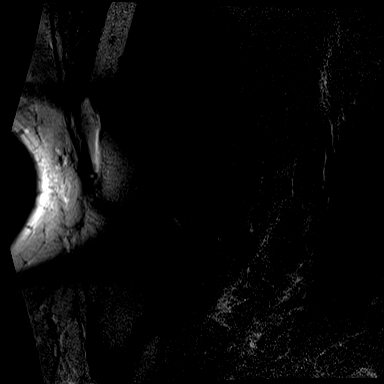
[im 17/28]
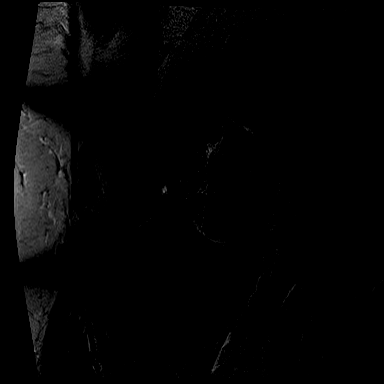
[im 22/28]
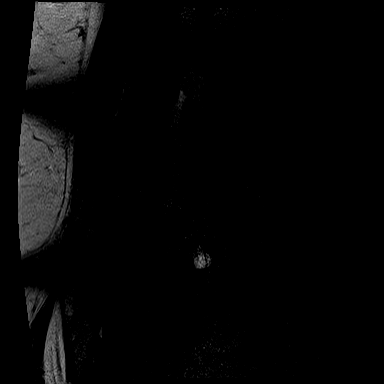
[im 28/28]
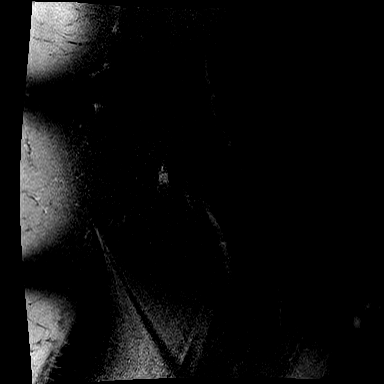

[40 of 40 positions shown; findings below may reference images not displayed]

FINDINGS: Study is limited by body habitus. The knee coil could not be
utilized.

MENISCI

Medial meniscus: There is heterogeneous signal and mild contour
irregularity of the posterior horn on the sagittal images,
suspicious for a degenerative tear. No well-defined radial tear is
seen on the coronal images. There is mild peripheral extrusion of
the meniscus from the joint space on the coronal images. No
centrally displaced meniscal fragments are seen.

Lateral meniscus:  Intact with normal morphology.

LIGAMENTS

Cruciates:  Intact.

Collaterals:  Intact.

CARTILAGE

Patellofemoral: Mild patellofemoral degenerative changes without
significant subchondral signal abnormality.

Medial: Moderate chondral thinning, surface irregularity and
osteophyte formation.

Lateral: Mild chondral thinning and peripheral osteophyte formation.

MISCELLANEOUS

Joint:  No significant joint effusion.

Popliteal Fossa:  Unremarkable. No significant Baker's cyst.

Extensor Mechanism:  Intact.

Bones: No acute osseous findings. Marrow changes in the distal femur
and proximal tibia attributed to body habitus.

Other: No other significant periarticular soft tissue findings.
IMPRESSION: 1. Limited examination due to body habitus. The knee coil could not
be utilized.
2. Probable degenerative tear of the posterior horn of the medial
meniscus near the meniscal root.
3. The lateral meniscus, cruciate and collateral ligaments are
intact.
4. Tricompartmental degenerative changes, most advanced in the
medial compartment. No acute osseous findings.

## 2024-11-17 ENCOUNTER — Ambulatory Visit: Admitting: Physician Assistant

## 2024-11-17 ENCOUNTER — Encounter: Payer: Self-pay | Admitting: Physician Assistant

## 2024-11-17 ENCOUNTER — Ambulatory Visit: Payer: Self-pay

## 2024-11-17 VITALS — BP 150/80 | HR 106 | Ht 65.0 in | Wt 387.0 lb

## 2024-11-17 DIAGNOSIS — Z6841 Body Mass Index (BMI) 40.0 and over, adult: Secondary | ICD-10-CM

## 2024-11-17 DIAGNOSIS — I878 Other specified disorders of veins: Secondary | ICD-10-CM

## 2024-11-17 DIAGNOSIS — I10 Essential (primary) hypertension: Secondary | ICD-10-CM | POA: Diagnosis not present

## 2024-11-17 DIAGNOSIS — R7303 Prediabetes: Secondary | ICD-10-CM

## 2024-11-17 DIAGNOSIS — R739 Hyperglycemia, unspecified: Secondary | ICD-10-CM

## 2024-11-17 DIAGNOSIS — E66813 Obesity, class 3: Secondary | ICD-10-CM

## 2024-11-17 DIAGNOSIS — R600 Localized edema: Secondary | ICD-10-CM | POA: Diagnosis not present

## 2024-11-17 LAB — POCT GLYCOSYLATED HEMOGLOBIN (HGB A1C)
HbA1c POC (<> result, manual entry): 5.9 % (ref 4.0–5.6)
HbA1c, POC (controlled diabetic range): 5.9 % (ref 0.0–7.0)
HbA1c, POC (prediabetic range): 5.9 % (ref 5.7–6.4)
Hemoglobin A1C: 5.9 % — AB (ref 4.0–5.6)

## 2024-11-17 MED ORDER — AMLODIPINE BESYLATE 5 MG PO TABS: 5.0000 mg | ORAL_TABLET | Freq: Every day | ORAL | 0 refills | Status: DC

## 2024-11-17 NOTE — Patient Instructions (Addendum)
 VISIT SUMMARY:  Today, we discussed your elevated blood pressure and foot swelling, as well as your overall health and wellness. We reviewed your current medications and made some changes to better manage your conditions. We also talked about your diet, weight management, and future plans for your health.  YOUR PLAN:  -ESSENTIAL HYPERTENSION WITH LOWER EXTREMITY EDEMA AND VENOUS STASIS: You have high blood pressure and swelling in your lower legs, likely due to poor blood flow. We started you on a new medication called amlodipine to help control your blood pressure and reduce swelling. Please take 5 mg of amlodipine daily. We also ordered blood tests and asked you to monitor your blood pressure daily and keep a log. Elevate your feet when possible to help with the swelling. We will follow up in 2-3 weeks to see how you are doing.  -PREDIABETES: Your blood sugar levels are higher than normal but not high enough to be considered diabetes. Your A1c is 5.9%. Continue working with your nutritionist, monitor your blood sugar, and follow the dietary changes we discussed. We will keep an eye on your A1c levels.  -OBESITY, CLASS 3: You have a high body weight that may affect your health. You are doing well with Weight Watchers and working with a nutritionist. Keep up with the dietary changes and try to gradually increase your water intake.   DASH Eating Plan DASH stands for Dietary Approaches to Stop Hypertension. The DASH eating plan is a healthy eating plan that has been shown to: Lower high blood pressure (hypertension). Reduce your risk for type 2 diabetes, heart disease, and stroke. Help with weight loss. What are tips for following this plan? Reading food labels Check food labels for the amount of salt (sodium) per serving. Choose foods with less than 5 percent of the Daily Value (DV) of sodium. In general, foods with less than 300 milligrams (mg) of sodium per serving fit into this eating plan. To  find whole grains, look for the word whole as the first word in the ingredient list. Shopping Buy products labeled as low-sodium or no salt added. Buy fresh foods. Avoid canned foods and pre-made or frozen meals. Cooking Try not to add salt when you cook. Use salt-free seasonings or herbs instead of table salt or sea salt. Check with your health care provider or pharmacist before using salt substitutes. Do not fry foods. Cook foods in healthy ways, such as baking, boiling, grilling, roasting, or broiling. Cook using oils that are good for your heart. These include olive, canola, avocado, soybean, and sunflower oil. Meal planning  Eat a balanced diet. This should include: 4 or more servings of fruits and 4 or more servings of vegetables each day. Try to fill half of your plate with fruits and vegetables. 6-8 servings of whole grains each day. 6 or less servings of lean meat, poultry, or fish each day. 1 oz is 1 serving. A 3 oz (85 g) serving of meat is about the same size as the palm of your hand. One egg is 1 oz (28 g). 2-3 servings of low-fat dairy each day. One serving is 1 cup (237 mL). 1 serving of nuts, seeds, or beans 5 times each week. 2-3 servings of heart-healthy fats. Healthy fats called omega-3 fatty acids are found in foods such as walnuts, flaxseeds, fortified milks, and eggs. These fats are also found in cold-water fish, such as sardines, salmon, and mackerel. Limit how much you eat of: Canned or prepackaged foods. Food that  is high in trans fat, such as fried foods. Food that is high in saturated fat, such as fatty meat. Desserts and other sweets, sugary drinks, and other foods with added sugar. Full-fat dairy products. Do not salt foods before eating. Do not eat more than 4 egg yolks a week. Try to eat at least 2 vegetarian meals a week. Eat more home-cooked food and less restaurant, buffet, and fast food. Lifestyle When eating at a restaurant, ask if your food can  be made with less salt or no salt. If you drink alcohol: Limit how much you have to: 0-1 drink a day if you are female. 0-2 drinks a day if you are female. Know how much alcohol is in your drink. In the [place] one drink is one 12 oz bottle of beer (355 mL), one 5 oz glass of wine (148 mL), or one 1 oz glass of hard liquor (44 mL). General information Avoid eating more than 2,300 mg of salt a day. If you have hypertension, you may need to reduce your sodium intake to 1,500 mg a day. Work with your provider to stay at a healthy body weight or lose weight. Ask what the best weight range is for you. On most days of the week, get at least 30 minutes of exercise that causes your heart to beat faster. This may include walking, swimming, or biking. Work with your provider or dietitian to adjust your eating plan to meet your specific calorie needs. What foods should I eat? Fruits All fresh, dried, or frozen fruit. Canned fruits that are in their natural juice and do not have sugar added to them. Vegetables Fresh or frozen vegetables that are raw, steamed, roasted, or grilled. Low-sodium or reduced-sodium tomato and vegetable juice. Low-sodium or reduced-sodium tomato sauce and tomato paste. Low-sodium or reduced-sodium canned vegetables. Grains Whole-grain or whole-wheat bread. Whole-grain or whole-wheat pasta. Brown rice. [place] Madeira. Bulgur. Whole-grain and low-sodium cereals. Pita bread. Low-fat, low-sodium crackers. Whole-wheat flour tortillas. Meats and other proteins Skinless chicken or turkey. Ground chicken or turkey. Pork with fat trimmed off. Fish and seafood. Egg whites. Dried beans, peas, or lentils. Unsalted nuts, nut butters, and seeds. Unsalted canned beans. Lean cuts of beef with fat trimmed off. Low-sodium, lean precooked or cured meat, such as sausages or meat loaves. Dairy Low-fat (1%) or fat-free (skim) milk. Reduced-fat, low-fat, or fat-free cheeses. Nonfat, low-sodium ricotta or  cottage cheese. Low-fat or nonfat yogurt. Low-fat, low-sodium cheese. Fats and oils Soft margarine without trans fats. Vegetable oil. Reduced-fat, low-fat, or light mayonnaise and salad dressings (reduced-sodium). Canola, safflower, olive, avocado, soybean, and sunflower oils. Avocado. Seasonings and condiments Herbs. Spices. Seasoning mixes without salt. Other foods Unsalted popcorn and pretzels. Fat-free sweets. The items listed above may not be all the foods and drinks you can have. Talk to a dietitian to learn more. What foods should I avoid? Fruits Canned fruit in a light or heavy syrup. Fried fruit. Fruit in cream or butter sauce. Vegetables Creamed or fried vegetables. Vegetables in a cheese sauce. Regular canned vegetables that are not marked as low-sodium or reduced-sodium. Regular canned tomato sauce and paste that are not marked as low-sodium or reduced-sodium. Regular tomato and vegetable juices that are not marked as low-sodium or reduced-sodium. Dene. Olives. Grains Baked goods made with fat, such as croissants, muffins, or some breads. Dry pasta or rice meal packs. Meats and other proteins Fatty cuts of meat. Ribs. Fried meat. [place]ona. Bologna, salami, and other precooked or cured meats,  such as sausages or meat loaves, that are not lean and low in sodium. Fat from the back of a pig (fatback). Bratwurst. Salted nuts and seeds. Canned beans with added salt. Canned or smoked fish. Whole eggs or egg yolks. Chicken or turkey with skin. Dairy Whole or 2% milk, cream, and half-and-half. Whole or full-fat cream cheese. Whole-fat or sweetened yogurt. Full-fat cheese. Nondairy creamers. Whipped toppings. Processed cheese and cheese spreads. Fats and oils Butter. Stick margarine. Lard. Shortening. Ghee. Bacon fat. Tropical oils, such as coconut, palm kernel, or palm oil. Seasonings and condiments Onion salt, garlic salt, seasoned salt, table salt, and sea salt. Worcestershire sauce.  Tartar sauce. Barbecue sauce. Teriyaki sauce. Soy sauce, including reduced-sodium soy sauce. Steak sauce. Canned and packaged gravies. Fish sauce. Oyster sauce. Cocktail sauce. Store-bought horseradish. Ketchup. Mustard. Meat flavorings and tenderizers. Bouillon cubes. Hot sauces. Pre-made or packaged marinades. Pre-made or packaged taco seasonings. Relishes. Regular salad dressings. Other foods Salted popcorn and pretzels. The items listed above may not be all the foods and drinks you should avoid. Talk to a dietitian to learn more. Where to find more information National Heart, Lung, and Blood Institute (NHLBI): buffalodrycleaner.gl American Heart Association (AHA): heart.org Academy of Nutrition and Dietetics: eatright.org National Kidney Foundation (NKF): kidney.org This information is not intended to replace advice given to you by your health care provider. Make sure you discuss any questions you have with your health care provider. Document Revised: 01/01/2023 Document Reviewed: 01/01/2023 Elsevier Patient Education  2024 [employer].

## 2024-11-17 NOTE — Progress Notes (Signed)
 New Patient Office Visit  Subjective    Patient ID: Sierra Mitchell, female    DOB: March 06, 1971  Age: 53 y.o. MRN: [id number]  CC:  Chief Complaint  Patient presents with   Hypertension    She went to the dentist to have her tooth pulled and her BP was 180/110   Discussed the use of AI scribe software for clinical note transcription with the patient, who gave verbal consent to proceed.  History of Present Illness   Sierra Mitchell is a 53 year old female with hypertension who presents to the mobile medicine unit after a dental visit to have a tooth extraction and her blood pressure was 180/110. She is accompanied by her daughter.  She has not taken hydrochlorothiazide for at least a year due to insurance changes and loss of her primary care provider. Her blood pressure at home was previously around 140/80 mmHg on medication. She experiences anxiety and elevated blood pressure during dental procedures due to a fear of suffocation. Her dentist is concerned about her blood pressure management before dental work.  Significant foot swelling has been present for about six months, with occasional dark red or purple discoloration noted by her daughter. The swelling is more pronounced in her right foot, which has remained swollen since an injury involving a can of chicken.  She consumes about two to three glasses of water per day and has reduced her soda intake, eliminating [facility] for over two months. She is working with a health and safety inspector and has started [employer] to improve her diet, focusing on fruits and vegetables. Despite decreased mobility following knee surgery in 2020, she reports no significant weight change over the past five years. She requires surgery on her other knee but has been advised to lose weight first. She averages six hours of sleep per night and describes herself as a night owl. She experiences shortness of breath when climbing stairs.     Outpatient Encounter  Medications as of 11/17/2024  Medication Sig   amLODipine  (NORVASC ) 5 MG tablet Take 1 tablet (5 mg total) by mouth daily.   hydrochlorothiazide (HYDRODIURIL) 25 MG tablet Take 25 mg by mouth at bedtime.  (Patient not taking: Reported on 11/17/2024)   HYDROcodone -acetaminophen  (NORCO) 5-325 MG tablet Take 1-2 tablets by mouth every 4 (four) hours as needed for moderate pain or severe pain. (Patient not taking: Reported on 11/17/2024)   levonorgestrel (MIRENA) 20 MCG/24HR IUD 1 each by Intrauterine route once.   ondansetron  (ZOFRAN  ODT) 4 MG disintegrating tablet Take 1 tablet (4 mg total) by mouth every 8 (eight) hours as needed for nausea or vomiting.   No facility-administered encounter medications on file as of 11/17/2024.    Past Medical History:  Diagnosis Date   Arthritis    History of kidney stones    h/o   Hypertension     Past Surgical History:  Procedure Laterality Date   CHOLECYSTECTOMY     CYST EXCISION     shoulder   KNEE ARTHROSCOPY WITH MEDIAL MENISECTOMY Left 03/14/2019   Procedure: KNEE ARTHROSCOPY WITH PATIAL MEDIAL MENISECTOMY  LEFT;  Surgeon: Tobie Priest, MD;  Location: [facility] ORS;  Service: Orthopedics;  Laterality: Left;   PILONIDAL CYST EXCISION     TUMOR EXCISION     left arm    History reviewed. No pertinent family history.  Social History   Socioeconomic History   Marital status: Married    Spouse name: Not on file   Number of children: Not  on file   Years of education: Not on file   Highest education level: Not on file  Occupational History   Not on file  Tobacco Use   Smoking status: Never   Smokeless tobacco: Never  Vaping Use   Vaping status: Never Used  Substance and Sexual Activity   Alcohol use: Yes    Comment: social-rare   Drug use: Never   Sexual activity: Not on file  Other Topics Concern   Not on file  Social History Narrative   Not on file   Social Drivers of Health   Financial Resource Strain: Not on file  Food  Insecurity: No Food Insecurity (11/17/2024)   Hunger Vital Sign    Worried About Running Out of Food in the Last Year: Never true    Ran Out of Food in the Last Year: Never true  Transportation Needs: No Transportation Needs (11/17/2024)   PRAPARE - Administrator, Civil Service (Medical): No    Lack of Transportation (Non-Medical): No  Physical Activity: Not on file  Stress: Not on file  Social Connections: Not on file  Intimate Partner Violence: Not At Risk (11/17/2024)   Humiliation, Afraid, Rape, and Kick questionnaire    Fear of Current or Ex-Partner: No    Emotionally Abused: No    Physically Abused: No    Sexually Abused: No    Review of Systems  Constitutional: Negative.   HENT: Negative.    Eyes: Negative.   Respiratory: Negative.    Cardiovascular: Negative.   Gastrointestinal: Negative.   Genitourinary: Negative.   Musculoskeletal: Negative.   Skin: Negative.   Neurological: Negative.   Endo/Heme/Allergies: Negative.   Psychiatric/Behavioral: Negative.          Objective    BP (!) 150/80 (BP Location: Left Arm, Patient Position: Sitting, Cuff Size: Large)   Pulse (!) 106   Ht 5' 5 (1.651 m)   Wt (!) 387 lb (175.5 kg)   SpO2 99%   BMI 64.40 kg/m   Physical Exam Vitals and nursing note reviewed.  Constitutional:      Appearance: Normal appearance. She is obese.  HENT:     Head: Normocephalic and atraumatic.     Right Ear: Tympanic membrane and external ear normal.     Left Ear: Tympanic membrane and external ear normal.     Mouth/Throat:     Mouth: Mucous membranes are moist.     Pharynx: Oropharynx is clear.  Eyes:     Extraocular Movements: Extraocular movements intact.     Conjunctiva/sclera: Conjunctivae normal.     Pupils: Pupils are equal, round, and reactive to light.  Cardiovascular:     Rate and Rhythm: Normal rate and regular rhythm.     Pulses: Normal pulses.     Heart sounds: Normal heart sounds.  Pulmonary:      Effort: Pulmonary effort is normal.     Breath sounds: Normal breath sounds.  Abdominal:     General: Bowel sounds are normal.     Palpations: Abdomen is soft.  Musculoskeletal:        General: Normal range of motion.     Cervical back: Normal range of motion and neck supple.  Skin:    General: Skin is warm and dry.     Capillary Refill: Capillary refill takes less than 2 seconds.  Neurological:     Mental Status: She is alert.         Assessment & Plan:  Problem List Items Addressed This Visit   None Visit Diagnoses       Elevated random blood glucose level    -  Primary   Relevant Orders   HgB A1c (Completed)     Essential hypertension       Relevant Medications   amLODipine  (NORVASC ) 5 MG tablet   Other Relevant Orders   CBC with Differential/Platelet (Completed)   Comp. Metabolic Panel (12) (Completed)     Prediabetes         Class 3 severe obesity due to excess calories with body mass index (BMI) of 60.0 to 69.9 in adult, unspecified whether serious comorbidity present (HCC)         Results LABS Random Glucose: 148 mg/dL Hemoglobin J8r: 4.0% (88/79/7974)  Assessment and Plan Essential hypertension with lower extremity edema and venous stasis Hypertension with BP 180/110 mmHg, likely anxiety-related.  Symptoms include 2+ pitting edema and foot redness.  - Started amlodipine  5 mg daily. Will consider restarting hydrochlorothiazide or adding Lasix  based on lab results - Ordered CBC and CMP. - Instructed to monitor BP daily and log. - Advised foot elevation for edema. - Follow-up in 2-3 weeks for BP and medication efficacy.  Prediabetes A1c 5.9%. Reduced soda intake, increased water. Working with nutritionist. Discussed blood sugar monitoring and future interventions. - Continue dietary modifications and nutritionist collaboration. - Monitor blood sugar and follow up on A1c.  Obesity, class 3 Class 3 obesity, stable weight. Limited mobility post-knee  surgery. Engaged in [employer] and nutritionist guidance. Discussed potential GLP-1 agonists use. - Continue Weight Watchers and dietary modifications. - Encouraged gradual water intake increase. - Discuss potential GLP-1 agonists in future visits.   I have reviewed the patient's medical history (PMH, PSH, Social History, Family History, Medications, and allergies) , and have been updated if relevant. I spent 30 minutes reviewing chart and  face to face time with patient.    Return in about 2 weeks (around 12/01/2024).   Kirk RAMAN Mayers, PA-C

## 2024-11-17 NOTE — Telephone Encounter (Signed)
    Copied from CRM [id number]. Topic: Clinical - Red Word Triage >> Nov 17, 2024  1:54 PM Joesph B wrote: Red Word that prompted transfer to Nurse Triage: bp 180/110, went to the dentist today and they would not pull her tooth due to her BP. Reason for Disposition  Systolic BP >= 180 OR Diastolic >= 110  Answer Assessment - Initial Assessment Questions Patient had dental appointment today to have tooth removed, they noted her blood pressure elevated at 180/110, they advised she would need pcp evaluation and blood pressure control before they will remove her tooth. New patient appointment has been scheduled on 01/06/25. Advised Mobile medicine clinic today for evaluation of hypertension, patient is agreeable to mobile medicine clinic, address and hours provided for clinic today.     1. BLOOD PRESSURE: What is your blood pressure? Did you take at least two measurements 5 minutes apart?     180/110 2. ONSET: When did you take your blood pressure?     today 3. HOW: How did you take your blood pressure? (e.g., automatic home BP monitor, visiting nurse)     At dental  4. HISTORY: Do you have a history of high blood pressure?     Yes, not on medication for one year. Due to insurance she was no longer covered at [facility].  5. MEDICINES: Are you taking any medicines for blood pressure? Have you missed any doses recently?     None  6. OTHER SYMPTOMS: Do you have any symptoms? (e.g., blurred vision, chest pain, difficulty breathing, headache, weakness)     Mild bilateral foot swelling.  7. PREGNANCY: Is there any chance you are pregnant? When was your last menstrual period?  Protocols used: Blood Pressure - High-A-AH

## 2024-11-18 ENCOUNTER — Ambulatory Visit: Payer: Self-pay | Admitting: Physician Assistant

## 2024-11-18 ENCOUNTER — Encounter: Payer: Self-pay | Admitting: Physician Assistant

## 2024-11-18 DIAGNOSIS — R600 Localized edema: Secondary | ICD-10-CM

## 2024-11-18 LAB — CBC WITH DIFFERENTIAL/PLATELET
Basophils Absolute: 0.1 x10E3/uL (ref 0.0–0.2)
Basos: 1 %
EOS (ABSOLUTE): 0.1 x10E3/uL (ref 0.0–0.4)
Eos: 1 %
Hematocrit: 43.5 % (ref 34.0–46.6)
Hemoglobin: 14.1 g/dL (ref 11.1–15.9)
Immature Grans (Abs): 0 x10E3/uL (ref 0.0–0.1)
Immature Granulocytes: 0 %
Lymphocytes Absolute: 3.1 x10E3/uL (ref 0.7–3.1)
Lymphs: 33 %
MCH: 29.9 pg (ref 26.6–33.0)
MCHC: 32.4 g/dL (ref 31.5–35.7)
MCV: 92 fL (ref 79–97)
Monocytes Absolute: 0.3 x10E3/uL (ref 0.1–0.9)
Monocytes: 3 %
Neutrophils Absolute: 5.9 x10E3/uL (ref 1.4–7.0)
Neutrophils: 62 %
Platelets: 395 x10E3/uL (ref 150–450)
RBC: 4.72 x10E6/uL (ref 3.77–5.28)
RDW: 13.1 % (ref 11.7–15.4)
WBC: 9.5 x10E3/uL (ref 3.4–10.8)

## 2024-11-18 LAB — COMP. METABOLIC PANEL (12)
AST: 21 IU/L (ref 0–40)
Albumin: 4.2 g/dL (ref 3.8–4.9)
Alkaline Phosphatase: 63 IU/L (ref 49–135)
BUN/Creatinine Ratio: 12 (ref 9–23)
BUN: 8 mg/dL (ref 6–24)
Bilirubin Total: 0.6 mg/dL (ref 0.0–1.2)
Calcium: 9.6 mg/dL (ref 8.7–10.2)
Chloride: 98 mmol/L (ref 96–106)
Creatinine, Ser: 0.65 mg/dL (ref 0.57–1.00)
Globulin, Total: 3 g/dL (ref 1.5–4.5)
Glucose: 128 mg/dL — ABNORMAL HIGH (ref 70–99)
Potassium: 4.3 mmol/L (ref 3.5–5.2)
Sodium: 139 mmol/L (ref 134–144)
Total Protein: 7.2 g/dL (ref 6.0–8.5)
eGFR: 106 mL/min/1.73 (ref >59)

## 2024-11-18 MED ORDER — FUROSEMIDE 20 MG PO TABS: 10.0000 mg | ORAL_TABLET | Freq: Every day | ORAL | 3 refills | Status: DC

## 2024-11-21 ENCOUNTER — Other Ambulatory Visit: Payer: Self-pay | Admitting: Physician Assistant

## 2024-11-21 DIAGNOSIS — R600 Localized edema: Secondary | ICD-10-CM

## 2024-11-21 MED ORDER — FUROSEMIDE 20 MG PO TABS: 10.0000 mg | ORAL_TABLET | Freq: Every day | ORAL | 0 refills | Status: DC

## 2024-11-21 NOTE — Progress Notes (Signed)
 Name and DOB verified. Pt is aware of lab results and provider recommendations. Please send Rx for Lasix  to [facility] in [place]. Pharmacy corrected in chart

## 2024-11-29 ENCOUNTER — Encounter: Payer: Self-pay | Admitting: Physician Assistant

## 2024-11-29 ENCOUNTER — Ambulatory Visit: Admitting: Physician Assistant

## 2024-11-29 VITALS — BP 150/67 | HR 98 | Ht 65.0 in | Wt 387.0 lb

## 2024-11-29 DIAGNOSIS — Z1231 Encounter for screening mammogram for malignant neoplasm of breast: Secondary | ICD-10-CM

## 2024-11-29 DIAGNOSIS — R600 Localized edema: Secondary | ICD-10-CM

## 2024-11-29 DIAGNOSIS — I10 Essential (primary) hypertension: Secondary | ICD-10-CM

## 2024-11-29 DIAGNOSIS — R7303 Prediabetes: Secondary | ICD-10-CM

## 2024-11-29 DIAGNOSIS — E66813 Obesity, class 3: Secondary | ICD-10-CM

## 2024-11-29 MED ORDER — AMLODIPINE BESYLATE 5 MG PO TABS: 5.0000 mg | ORAL_TABLET | Freq: Every day | ORAL | 0 refills | Status: DC

## 2024-11-29 MED ORDER — FUROSEMIDE 20 MG PO TABS: 20.0000 mg | ORAL_TABLET | Freq: Every day | ORAL | 1 refills | Status: AC | PRN

## 2024-11-29 MED ORDER — TIRZEPATIDE 2.5 MG/0.5ML ~~LOC~~ SOAJ: 2.5000 mg | SUBCUTANEOUS | 1 refills | Status: DC

## 2024-11-29 NOTE — Patient Instructions (Signed)
 VISIT SUMMARY:  Today, we reviewed your blood pressure management, discussed your medication regimen, and explored options for weight management. We also covered general health maintenance, including necessary screenings.  YOUR PLAN:  -ESSENTIAL HYPERTENSION WITH EDEMA: You have high blood pressure, which is currently well-controlled with your medication, amlodipine . However, you still experience some swelling in your ankles. We will continue your current medication and increase your dose of Lasix  to help manage the swelling. Additionally, we recommend reducing your salt intake, using compression socks, and elevating your feet to help with the swelling.  -OBESITY, CLASS 3: You have a high body mass index (BMI), which classifies you as having obesity. We discussed starting a new medication, tirzepatide, to help with weight management. This medication will require gradual dose increases and monitoring. We also emphasized the importance of staying hydrated and eating a high-protein, high-fiber diet. You were instructed on how to properly inject this medication.  -PREDIABETES: You have higher than normal blood sugar levels, but not high enough to be classified as diabetes. No changes were made to your management plan for this condition today.  -GENERAL HEALTH MAINTENANCE: We discussed the importance of regular health screenings. You will need a mammogram and a cervical cancer screening. We also talked about options for colon cancer screening, such as a colonoscopy or Cologuard test.  INSTRUCTIONS:  Please continue taking your amlodipine  5 mg daily and increase your Lasix  to 20 mg daily as needed. Follow the lifestyle recommendations provided to help manage your swelling. Start the tirzepatide as prescribed once it is approved by your insurance, and follow the instructions for hydration and diet. Schedule your mammogram and cervical cancer screening, and consider your options for colon cancer screening.  Follow up with us  as needed for any concerns or questions.

## 2024-11-29 NOTE — Progress Notes (Signed)
 Established Patient Office Visit  Subjective   Patient ID: Sierra Mitchell, female    DOB: 1971-07-23  Age: 53 y.o. MRN: [id number]  Chief Complaint  Patient presents with   Hypertension    2 week follow up   Discussed the use of AI scribe software for clinical note transcription with the patient, who gave verbal consent to proceed.  History of Present Illness   Sierra Mitchell is a 53 year old female with hypertension who presents for follow-up on blood pressure management and medication review.  She monitors her blood pressure at home with a wrist cuff, with readings usually 130-140/60-75 mmHg. The most recent was 124/72 mmHg. She takes amlodipine  5 mg daily.  She previously took hydrochlorothiazide but stopped after insurance changes and poor BP control. She could not refill it when her insurance no longer covered her prior primary care doctor.  She started furosemide  (Lasix ) and takes half a tablet as needed for leg swelling. She has noted reduced swelling but still has ankle stiffness with movement, with improvement in swelling by morning.  She is interested in starting a GLP-1 receptor agonist for weight management and is unsure what her [employer] plan will cover.     Past Medical History:  Diagnosis Date   Arthritis    History of kidney stones    h/o   Hypertension    Social History   Socioeconomic History   Marital status: Married    Spouse name: Not on file   Number of children: Not on file   Years of education: Not on file   Highest education level: Not on file  Occupational History   Not on file  Tobacco Use   Smoking status: Never   Smokeless tobacco: Never  Vaping Use   Vaping status: Never Used  Substance and Sexual Activity   Alcohol use: Yes    Comment: social-rare   Drug use: Never   Sexual activity: Yes    Birth control/protection: I.U.D.  Other Topics Concern   Not on file  Social History Narrative   Not on file    Social Drivers of Health   Financial Resource Strain: Not on file  Food Insecurity: No Food Insecurity (11/17/2024)   Hunger Vital Sign    Worried About Running Out of Food in the Last Year: Never true    Ran Out of Food in the Last Year: Never true  Transportation Needs: No Transportation Needs (11/17/2024)   PRAPARE - Administrator, Civil Service (Medical): No    Lack of Transportation (Non-Medical): No  Physical Activity: Not on file  Stress: Not on file  Social Connections: Not on file  Intimate Partner Violence: Not At Risk (11/17/2024)   Humiliation, Afraid, Rape, and Kick questionnaire    Fear of Current or Ex-Partner: No    Emotionally Abused: No    Physically Abused: No    Sexually Abused: No   History reviewed. No pertinent family history. Allergies  Allergen Reactions   Lisinopril Cough   Valsartan Swelling    ROS    Objective:     BP (!) 150/67 (BP Location: Left Arm, Patient Position: Sitting)   Pulse 98   Ht 5' 5 (1.651 m)   Wt (!) 387 lb (175.5 kg)   SpO2 98%   BMI 64.40 kg/m  BP Readings from Last 3 Encounters:  11/29/24 (!) 150/67  11/17/24 (!) 150/80  03/14/19 (!) 176/98   Wt Readings from Last  3 Encounters:  11/29/24 (!) 387 lb (175.5 kg)  11/17/24 (!) 387 lb (175.5 kg)  03/11/19 (!) 391 lb (177.4 kg)    Physical Exam Vitals and nursing note reviewed.  Constitutional:      Appearance: Normal appearance. She is obese.  HENT:     Head: Normocephalic.     Right Ear: External ear normal.     Left Ear: External ear normal.     Nose: Nose normal.     Mouth/Throat:     Mouth: Mucous membranes are moist.     Pharynx: Oropharynx is clear.  Eyes:     Extraocular Movements: Extraocular movements intact.     Conjunctiva/sclera: Conjunctivae normal.     Pupils: Pupils are equal, round, and reactive to light.  Cardiovascular:     Rate and Rhythm: Normal rate and regular rhythm.     Pulses:          Dorsalis pedis pulses are  1+ on the right side and 1+ on the left side.       Posterior tibial pulses are 1+ on the right side and 1+ on the left side.     Heart sounds: Normal heart sounds.  Pulmonary:     Effort: Pulmonary effort is normal.     Breath sounds: Normal breath sounds.  Musculoskeletal:        General: Normal range of motion.     Cervical back: Normal range of motion and neck supple.     Right lower leg: 2+ Edema present.     Left lower leg: 2+ Edema present.  Skin:    General: Skin is warm and dry.  Neurological:     General: No focal deficit present.     Mental Status: She is alert and oriented to person, place, and time.  Psychiatric:        Mood and Affect: Mood normal.        Behavior: Behavior normal.        Thought Content: Thought content normal.        Judgment: Judgment normal.        Assessment & Plan:   Problem List Items Addressed This Visit   None Visit Diagnoses       Prediabetes    -  Primary   Relevant Medications   tirzepatide  (MOUNJARO ) 2.5 MG/0.5ML Pen     Essential hypertension       Relevant Medications   tirzepatide  (MOUNJARO ) 2.5 MG/0.5ML Pen   amLODipine  (NORVASC ) 5 MG tablet   furosemide  (LASIX ) 20 MG tablet     Localized edema       Relevant Medications   furosemide  (LASIX ) 20 MG tablet     Class 3 severe obesity due to excess calories with body mass index (BMI) of 60.0 to 69.9 in adult, unspecified whether serious comorbidity present (HCC)       Relevant Medications   tirzepatide  (MOUNJARO ) 2.5 MG/0.5ML Pen     Encounter for screening mammogram for malignant neoplasm of breast       Relevant Orders   MM DIGITAL SCREENING BILATERAL     Assessment and Plan Essential hypertension with edema Blood pressure controlled with amlodipine . Persistent ankle edema despite Lasix . Kidney function supports diuretic use. Amlodipine  preferred over hydrochlorothiazide. - Continue amlodipine  5 mg daily. - Increase Lasix  to 20 mg daily as needed, adjust based on  edema and blood pressure. - Advised lifestyle modifications: reduce sodium, use compression socks, elevate feet.  Obesity, class 3 Discussed  GLP-1 agonist therapy. Chose tirzepatide based on preference and feedback. Discussed side effects and importance of hydration and protein intake. Emphasized gradual dose escalation and monitoring. - Prescribed tirzepatide, submitted for insurance approval. - Advised hydration (64 ounces water daily) and high-protein, high-fiber diet. - Instructed on injection techniques and timing.  Prediabetes No changes in management during this visit.  General Health Maintenance Discussed importance of regular screenings. No recent mammogram or cervical cancer screening. Discussed colon cancer screening options, emphasizing colonoscopy benefits. - Ordered mammogram. - Provided gynecologist referral for cervical cancer screening. - Discussed colon cancer screening options: colonoscopy or Cologuard.  The patient was given clear instructions to go to ER or return to medical center if symptoms don't improve, worsen or new problems develop. The patient verbalized understanding.    I have reviewed the patient's medical history (PMH, PSH, Social History, Family History, Medications, and allergies) , and have been updated if relevant. I spent 30 minutes reviewing chart and  face to face time with patient.      Return if symptoms worsen or fail to improve.    Kirk RAMAN Mayers, PA-C

## 2024-12-07 NOTE — Telephone Encounter (Signed)
Could you help [place] with this?

## 2024-12-08 ENCOUNTER — Other Ambulatory Visit: Payer: Self-pay

## 2024-12-12 ENCOUNTER — Other Ambulatory Visit: Payer: Self-pay | Admitting: Physician Assistant

## 2024-12-12 DIAGNOSIS — I10 Essential (primary) hypertension: Secondary | ICD-10-CM

## 2024-12-24 ENCOUNTER — Encounter: Payer: Self-pay | Admitting: Physician Assistant

## 2024-12-26 ENCOUNTER — Other Ambulatory Visit: Payer: Self-pay | Admitting: Physician Assistant

## 2024-12-26 DIAGNOSIS — I10 Essential (primary) hypertension: Secondary | ICD-10-CM

## 2024-12-26 MED ORDER — AMLODIPINE BESYLATE 5 MG PO TABS: 5.0000 mg | ORAL_TABLET | Freq: Every day | ORAL | 0 refills | Status: DC

## 2024-12-27 ENCOUNTER — Other Ambulatory Visit: Payer: Self-pay

## 2024-12-27 NOTE — Telephone Encounter (Signed)
"  Working on this    "

## 2025-01-06 ENCOUNTER — Ambulatory Visit: Admitting: Family Medicine

## 2025-01-06 VITALS — BP 145/84 | HR 86 | Ht 65.0 in | Wt 390.2 lb

## 2025-01-06 DIAGNOSIS — Z23 Encounter for immunization: Secondary | ICD-10-CM

## 2025-01-06 DIAGNOSIS — R7303 Prediabetes: Secondary | ICD-10-CM

## 2025-01-06 DIAGNOSIS — N920 Excessive and frequent menstruation with regular cycle: Secondary | ICD-10-CM | POA: Diagnosis not present

## 2025-01-06 DIAGNOSIS — R600 Localized edema: Secondary | ICD-10-CM

## 2025-01-06 DIAGNOSIS — T8332XA Displacement of intrauterine contraceptive device, initial encounter: Secondary | ICD-10-CM

## 2025-01-06 DIAGNOSIS — K429 Umbilical hernia without obstruction or gangrene: Secondary | ICD-10-CM | POA: Diagnosis not present

## 2025-01-06 DIAGNOSIS — Z87828 Personal history of other (healed) physical injury and trauma: Secondary | ICD-10-CM

## 2025-01-06 DIAGNOSIS — Z7689 Persons encountering health services in other specified circumstances: Secondary | ICD-10-CM

## 2025-01-06 DIAGNOSIS — Z1231 Encounter for screening mammogram for malignant neoplasm of breast: Secondary | ICD-10-CM

## 2025-01-06 DIAGNOSIS — Z789 Other specified health status: Secondary | ICD-10-CM

## 2025-01-06 DIAGNOSIS — Z1159 Encounter for screening for other viral diseases: Secondary | ICD-10-CM

## 2025-01-06 DIAGNOSIS — Z6841 Body Mass Index (BMI) 40.0 and over, adult: Secondary | ICD-10-CM

## 2025-01-06 DIAGNOSIS — Z136 Encounter for screening for cardiovascular disorders: Secondary | ICD-10-CM

## 2025-01-06 DIAGNOSIS — Z713 Dietary counseling and surveillance: Secondary | ICD-10-CM

## 2025-01-06 DIAGNOSIS — I10 Essential (primary) hypertension: Secondary | ICD-10-CM | POA: Diagnosis not present

## 2025-01-06 DIAGNOSIS — Z114 Encounter for screening for human immunodeficiency virus [HIV]: Secondary | ICD-10-CM

## 2025-01-06 DIAGNOSIS — Z130 Encounter for screening for diseases of the blood and blood-forming organs and certain disorders involving the immune mechanism: Secondary | ICD-10-CM

## 2025-01-06 MED ORDER — TIRZEPATIDE 2.5 MG/0.5ML ~~LOC~~ SOAJ: 2.5000 mg | SUBCUTANEOUS | 1 refills | Status: DC

## 2025-01-06 MED ORDER — AMLODIPINE BESYLATE 10 MG PO TABS: 10.0000 mg | ORAL_TABLET | Freq: Every day | ORAL | 1 refills | Status: AC

## 2025-01-06 NOTE — Patient Instructions (Addendum)

## 2025-01-06 NOTE — Progress Notes (Signed)
 "   New patient visit   Patient: Sierra Mitchell   DOB: 1971-07-04   54 y.o. Female  MRN: [id number] Visit Date: 01/06/2025  Today's healthcare provider: LAURAINE LOISE BUOY, DO   Chief Complaint  Patient presents with   New Patient (Initial Visit)    Patient is present to establish care. Pt reports she would like provider to take over rx for GLP1 as it was never completed for authorization at walk in clinc. Reports she believes her IUD fell out. Would like to do cervical exam here in office at next physical. Ok to update Tetanus vaccine and declined all others. Would like to hold off on colonoscopy. Mammogram ordered at walk-in clinic just needs to call Breast center back to schedule but would prefer to go somewhere in [place] or [place].   Subjective    Sierra Mitchell is a 54 y.o. female who presents today as a new patient to establish care.  HPI HPI     New Patient (Initial Visit)    Additional comments: Patient is present to establish care. Pt reports she would like provider to take over rx for GLP1 as it was never completed for authorization at walk in clinc. Reports she believes her IUD fell out. Would like to do cervical exam here in office at next physical. Ok to update Tetanus vaccine and declined all others. Would like to hold off on colonoscopy. Mammogram ordered at walk-in clinic just needs to call Breast center back to schedule but would prefer to go somewhere in [place] or [place].      Last edited by Lilian Fitzpatrick, CMA on 01/06/2025  4:15 PM.       Sierra Mitchell is a 54 year old female with prediabetes and hypertension who presents for an initial visit to establish care and address medication coverage issues.  She is experiencing issues with her medication coverage, specifically with Mounjaro , a GLP-1 receptor agonist, which requires a preauthorization form not returned by her previous office. She is interested in weight loss options due to limited mobility from a torn  meniscus in her knee. She has been on Weight Watchers for three months and is working with a nutritionist.  She has a history of hypertension and was started on amlodipine  5 mg daily recently. Her home blood pressure readings are usually in the 130s/70s range. She takes furosemide  for swelling in her ankles and feet, which has not significantly improved. She previously used hydrochlorothiazide but had to stop due to loss of insurance coverage. Her blood pressure was too high for a dental procedure, prompting the current medication regimen.  She has a history of an IUD placed approximately 15 years ago, which she believes may have fallen out as she started having periods again about a year ago. Her periods have become extremely heavy, requiring frequent changes of clothes and pads.  This is similar to the menstrual cycle she experienced prior to having the IUD placed.  She is interested in having the IUD checked due to the severity of her menstrual bleeding.  Her menstrual cycles are currently just under 4 weeks apart.  She has a history of an umbilical hernia diagnosed three to four years ago, which becomes sore with pressure but is not causing sharp pain. She has not been referred for further evaluation or treatment.    Past Medical History:  Diagnosis Date   Arthritis    History of kidney stones    h/o   Hypertension    Past Surgical  History:  Procedure Laterality Date   CHOLECYSTECTOMY     CYST EXCISION     shoulder   KNEE ARTHROSCOPY WITH MEDIAL MENISECTOMY Left 03/14/2019   Procedure: KNEE ARTHROSCOPY WITH PATIAL MEDIAL MENISECTOMY  LEFT;  Surgeon: Tobie Priest, MD;  Location: [facility] ORS;  Service: Orthopedics;  Laterality: Left;   PILONIDAL CYST EXCISION     TUMOR EXCISION     left arm   Family Status  Relation Name Status   Father Miquel Flatter Deceased  No partnership data on file   Family History  Problem Relation Age of Onset   Cancer Father    Social History    Socioeconomic History   Marital status: Married    Spouse name: Not on file   Number of children: Not on file   Years of education: Not on file   Highest education level: Bachelor's degree (e.g., BA, AB, BS)  Occupational History   Not on file  Tobacco Use   Smoking status: Never   Smokeless tobacco: Never  Vaping Use   Vaping status: Never Used  Substance and Sexual Activity   Alcohol use: Yes    Comment: Social drinks - about 2-3 times per year   Drug use: Never   Sexual activity: Yes    Birth control/protection: I.U.D.  Other Topics Concern   Not on file  Social History Narrative   Not on file   Social Drivers of Health   Tobacco Use: Low Risk (01/06/2025)   Patient History    Smoking Tobacco Use: Never    Smokeless Tobacco Use: Never    Passive Exposure: Not on file  Financial Resource Strain: Low Risk (01/06/2025)   Overall Financial Resource Strain (CARDIA)    Difficulty of Paying Living Expenses: Not hard at all  Food Insecurity: No Food Insecurity (01/06/2025)   Epic    Worried About Programme Researcher, Broadcasting/film/video in the Last Year: Never true    Ran Out of Food in the Last Year: Never true  Transportation Needs: No Transportation Needs (01/06/2025)   Epic    Lack of Transportation (Medical): No    Lack of Transportation (Non-Medical): No  Physical Activity: Inactive (01/06/2025)   Exercise Vital Sign    Days of Exercise per Week: 0 days    Minutes of Exercise per Session: Not on file  Stress: No Stress Concern Present (01/06/2025)   [employer] of Occupational Health - Occupational Stress Questionnaire    Feeling of Stress: Not at all  Social Connections: Moderately Integrated (01/06/2025)   Social Connection and Isolation Panel    Frequency of Communication with Friends and Family: More than three times a week    Frequency of Social Gatherings with Friends and Family: More than three times a week    Attends Religious Services: More than 4 times per year    Active  Member of Clubs or Organizations: No    Attends Banker Meetings: Not on file    Marital Status: Married  Depression (PHQ2-9): Low Risk (11/29/2024)   Depression (PHQ2-9)    PHQ-2 Score: 0  Alcohol Screen: Low Risk (01/06/2025)   Alcohol Screen    Last Alcohol Screening Score (AUDIT): 1  Housing: Low Risk (01/06/2025)   Epic    Unable to Pay for Housing in the Last Year: No    Number of Times Moved in the Last Year: 0    Homeless in the Last Year: No  Utilities: Not At Risk (11/17/2024)   Epic  Threatened with loss of utilities: No  Health Literacy: Not on file   Show/hide medication list[1] Allergies[2]  Immunization History  Administered Date(s) Administered   Tdap 06/15/2012, 01/06/2025    Health Maintenance  Topic Date Due   Mammogram  Never done   Cervical Cancer Screening (HPV/Pap Cotest)  01/27/2025 (Originally 10/14/2021)   Colonoscopy  02/27/2025 (Originally 08/17/2017)   Influenza Vaccine  03/28/2025 (Originally 07/29/2024)   Zoster Vaccines- Shingrix (1 of 2) 04/06/2025 (Originally 08/18/1991)   COVID-19 Vaccine (1) 05/29/2025 (Originally 02/17/1973)   Pneumococcal Vaccine: 50+ Years (1 of 1 - PCV) 01/06/2026 (Originally 08/17/2022)   Hepatitis B Vaccines 19-59 Average Risk (1 of 3 - 19+ 3-dose series) 01/06/2026 (Originally 08/18/1991)   Hepatitis C Screening  01/06/2026 (Originally 08/17/1990)   HIV Screening  01/06/2026 (Originally 08/18/1987)   DTaP/Tdap/Td (3 - Td or Tdap) 01/06/2035   HPV VACCINES (No Doses Required) Completed   Meningococcal B Vaccine  Aged Out    Patient Care Team: Sanjuana Mruk N, DO as PCP - General (Family Medicine)       Objective    BP (!) 145/84 (BP Location: Left Arm, Patient Position: Sitting, Cuff Size: Large)   Pulse 86   Ht 5' 5 (1.651 m)   Wt (!) 390 lb 3.2 oz (177 kg)   LMP 12/26/2024   SpO2 99%   BMI 64.93 kg/m     Physical Exam Vitals and nursing note reviewed.  Constitutional:      General: She is  not in acute distress.    Appearance: Normal appearance.  HENT:     Head: Normocephalic and atraumatic.  Eyes:     General: No scleral icterus.    Conjunctiva/sclera: Conjunctivae normal.  Cardiovascular:     Rate and Rhythm: Normal rate.  Pulmonary:     Effort: Pulmonary effort is normal.  Abdominal:     Hernia: A hernia (umbilical) is present.  Neurological:     Mental Status: She is alert and oriented to person, place, and time. Mental status is at baseline.  Psychiatric:        Mood and Affect: Mood normal.        Behavior: Behavior normal.     Depression Screen    11/29/2024    1:13 PM 11/17/2024    3:10 PM  PHQ 2/9 Scores  PHQ - 2 Score 0 0  PHQ- 9 Score 0 1   No results found for any visits on 01/06/25.  Assessment & Plan     Essential hypertension -     amLODIPine  Besylate; Take 1 tablet (10 mg total) by mouth daily.  Dispense: 90 tablet; Refill: 1 -     Tirzepatide ; Inject 2.5 mg into the skin once a week.  Dispense: 2 mL; Refill: 1  Encounter to establish care  Bilateral lower extremity edema  Prediabetes -     Tirzepatide ; Inject 2.5 mg into the skin once a week.  Dispense: 2 mL; Refill: 1  Menorrhagia with regular cycle  Intrauterine contraceptive device threads lost, initial encounter -     US  PELVIC COMPLETE WITH TRANSVAGINAL; Future  Umbilical hernia without obstruction and without gangrene -     Ambulatory referral to General Surgery  History of torn meniscus of left knee  Encounter for screening for HIV  Encounter for hepatitis C screening test for low risk patient  Screening for endocrine, metabolic and immunity disorder  Encounter for screening for cardiovascular disorders -     Lipid panel  Hepatitis B vaccination status unknown  Need for Tdap vaccination -     Tdap vaccine greater than or equal to 7yo IM  Encounter for screening mammogram for breast cancer -     3D Screening Mammogram, Left and Right; Future  Weight loss  counseling, encounter for  Morbid obesity with BMI of 60.0-69.9, adult [facility]) Assessment & Plan: Doing Weight Watchers program and working with a nutritionist. Following a calorie-restricted diet. Exercise limited by right-sided torn meniscus and associated knee pain.  Discussed GLP1 to faciliate weight loss. No personal hx pancreatitis. No personal or family hx of medullary thyroid cancer or multiple endocrine neoplasia type 2 (MEN2).  Patient was advised by her insurance that Mounjaro  would be approved for weight loss; discussed that this is unlikely as Mounjaro  is only approved for diabetes but will attempt to obtain prior authorization anyway based on what she was told by her insurance representative. - Attempt prior authorization for Mounjaro  for weight loss. - Continue dietary management with Weight Watchers and nutritionist.  Orders: -     Tirzepatide ; Inject 2.5 mg into the skin once a week.  Dispense: 2 mL; Refill: 1     Encounter to establish care Due for cervical cancer screening and mammogram. Last Pap smear in 2019. Hepatitis B vaccination status uncertain, declines antibody testing. Declines HIV and hepatitis C rescreening. - Ordered mammogram. - Scheduled Pap smear. - Discussed hepatitis B vaccination; she declined antibody testing. - Discussed HIV and hepatitis C screening; she declined rescreening.  Essential hypertension Chronic, elevated.  Recent initiation of amlodipine  and furosemide  by walk-in clinic. Home blood pressure readings in 130s/70s. Previous hydrochlorothiazide use stopped due to insurance issues. Current management suboptimal, goal <120/80 mmHg. - Increased amlodipine  dosage to 10 mg daily. - Consider adding hydrochlorothiazide for swelling management.  Bilateral lower extremity edema Chronic edema in ankles and feet, partially responsive to furosemide . Edema predates amlodipine  initiation. Previous hydrochlorothiazide use without significant improvement. -  Continue furosemide  20 mg daily as needed. - Consider adding hydrochlorothiazide for additional edema management.  Menorrhagia with regular cycle; intrauterine contraceptive device threads lost, initial encounter Resumption of heavy bleeding after 15 years of amenorrhea post-IUD placement. Current bleeding severe, requiring frequent pad changes.  Patient relates her IUD may have fallen out but is uncertain. - Will obtain ultrasound to determine if IUD remains present or has fallen out as patient believes. - Consider referral to gynecology for further evaluation and potential IUD removal, following ultrasound result.  Prediabetes Status with consideration for GLP-1 agonist therapy for weight loss. Insurance coverage uncertain due to prediabetes without progression to diabetes. - Attempt prior authorization for Mounjaro  for weight loss. - Focus on low carbohydrate diet with minimal added/simple sugars.  Umbilical hernia without obstruction and without gangrene Present for several years with occasional soreness under pressure. - Referred to surgery for evaluation of umbilical hernia.  History of bilateral meniscus tear Bilateral meniscus tears with previous surgery on one knee. Current mobility limited, weight loss required for surgical intervention on other (right) knee. - Continue weight management efforts to facilitate potential future knee surgery.     Return in about 6 weeks (around 02/17/2025) for CPE, pap w/Dr. Donzella or Dr. Franchot .     I discussed the assessment and treatment plan with the patient  The patient was provided an opportunity to ask questions and all were answered. The patient agreed with the plan and demonstrated an understanding of the instructions.   The patient was advised to call  back or seek an in-person evaluation if the symptoms worsen or if the condition fails to improve as anticipated.    LAURAINE LOISE BUOY, DO  [facility] [phone] (phone) [phone] (fax)  [facility]     [1]  Outpatient Medications Prior to Visit  Medication Sig   furosemide  (LASIX ) 20 MG tablet Take 1 tablet (20 mg total) by mouth daily as needed.   [DISCONTINUED] amLODipine  (NORVASC ) 5 MG tablet Take 1 tablet (5 mg total) by mouth daily.   [DISCONTINUED] hydrochlorothiazide (HYDRODIURIL) 25 MG tablet Take 25 mg by mouth at bedtime.  (Patient not taking: Reported on 11/29/2024)   [DISCONTINUED] HYDROcodone -acetaminophen  (NORCO) 5-325 MG tablet Take 1-2 tablets by mouth every 4 (four) hours as needed for moderate pain or severe pain. (Patient not taking: Reported on 11/29/2024)   [DISCONTINUED] levonorgestrel (MIRENA) 20 MCG/24HR IUD 1 each by Intrauterine route once.   [DISCONTINUED] ondansetron  (ZOFRAN  ODT) 4 MG disintegrating tablet Take 1 tablet (4 mg total) by mouth every 8 (eight) hours as needed for nausea or vomiting. (Patient not taking: Reported on 11/29/2024)   [DISCONTINUED] tirzepatide  (MOUNJARO ) 2.5 MG/0.5ML Pen Inject 2.5 mg into the skin once a week. (Patient not taking: Reported on 01/06/2025)   No facility-administered medications prior to visit.  [2]  Allergies Allergen Reactions   Lisinopril Cough   Valsartan Swelling   "

## 2025-01-06 NOTE — Assessment & Plan Note (Addendum)
 Doing Weight Watchers program and working with a nutritionist. Following a calorie-restricted diet. Exercise limited by right-sided torn meniscus and associated knee pain.  Discussed GLP1 to faciliate weight loss. No personal hx pancreatitis. No personal or family hx of medullary thyroid cancer or multiple endocrine neoplasia type 2 (MEN2).  Patient was advised by her insurance that Mounjaro  would be approved for weight loss; discussed that this is unlikely as Mounjaro  is only approved for diabetes but will attempt to obtain prior authorization anyway based on what she was told by her insurance representative. - Attempt prior authorization for Mounjaro  for weight loss. - Continue dietary management with Weight Watchers and nutritionist.

## 2025-01-16 ENCOUNTER — Telehealth: Payer: Self-pay | Admitting: Pharmacy Technician

## 2025-01-16 ENCOUNTER — Other Ambulatory Visit (HOSPITAL_COMMUNITY): Payer: Self-pay

## 2025-01-16 NOTE — Telephone Encounter (Signed)
 Pharmacy Patient Advocate Encounter   Received notification from Onbase CMM KEY that prior authorization for Mounjaro  2.5MG /0.5ML auto-injectors is required/requested.   Insurance verification completed.   The patient is insured through [facility].   Ozempic/Mounjaro  is approved exclusively as an adjunct to diet and exercise to improve glycemic  control in adults with type 2 diabetes mellitus. A review of patient's medical chart reveals no  documented diagnosis of type 2 diabetes or an A1C indicative of diabetes. Therefore, they do not  currently meet the criteria for prior authorization of this medication. If clinically appropriate, alternative  options such as Saxenda, Zepbound , or Georjean may be considered for this patient.

## 2025-01-17 NOTE — Telephone Encounter (Signed)
Please route to provider to review

## 2025-01-18 DIAGNOSIS — K429 Umbilical hernia without obstruction or gangrene: Secondary | ICD-10-CM | POA: Insufficient documentation

## 2025-01-18 DIAGNOSIS — N920 Excessive and frequent menstruation with regular cycle: Secondary | ICD-10-CM | POA: Insufficient documentation

## 2025-02-01 ENCOUNTER — Other Ambulatory Visit (HOSPITAL_COMMUNITY): Payer: Self-pay

## 2025-02-03 ENCOUNTER — Ambulatory Visit: Payer: Self-pay | Admitting: Surgery

## 2025-02-03 ENCOUNTER — Encounter: Payer: Self-pay | Admitting: Surgery

## 2025-02-03 VITALS — BP 175/75 | HR 118 | Temp 97.8°F | Ht 65.0 in | Wt 386.2 lb

## 2025-02-03 DIAGNOSIS — K420 Umbilical hernia with obstruction, without gangrene: Secondary | ICD-10-CM

## 2025-02-03 NOTE — Patient Instructions (Signed)
 We have scheduled you for a CT Scan of your Abdomen and Pelvis with contrast. This has been scheduled at [facility] on [address] on 02/08/2025 . Please arrive there by 7:45 am . If you need to reschedule your Scan, you may do so by calling [phone]. Please let us  know if you reschedule your scan as we have to get authorization from your insurance for this.      A Bulge in the Belly Caused by a Weak Spot (Umbilical Hernia) in Adults: What to Know  A hernia is a lump of tissue that pushes through an opening in the muscles. An umbilical hernia happens in the belly, near the belly button. The hernia may contain tissues from the small or large intestine. It may also have fatty tissue that covers the intestines. Umbilical hernias in adults may get worse over time. They need to be treated with surgery. There are several types of umbilical hernias. They include: Indirect hernia. This occurs just above or below the belly button. It's the most common type of umbilical hernia in adults. Direct hernia. This type occurs in an opening that's formed by the belly button. Reducible hernia. This hernia comes and goes. You may see it only when you strain, cough, or lift something heavy. This type of hernia can be pushed back into the belly (reduced). Incarcerated hernia. This traps the hernia in the wall of the belly. This type of hernia can't be pushed back into the belly. It can cause a strangulated hernia. Strangulated hernia. This hernia cuts off blood flow to the tissues inside the hernia. The tissues can die if this happens. This type of hernia must be treated right away. What are the causes? An umbilical hernia happens when tissue inside the belly pushes through an opening in the muscles of the belly. What increases the risk? You're more likely to get this hernia if: You strain while lifting or pushing heavy objects. You've had several pregnancies. You have a condition that puts pressure  on your belly, and you've had it for a long time. These include: Obesity. A buildup of fluid inside your belly. Vomiting or coughing all the time. Trouble pooping (constipation). You've had surgery that weakened the muscles in the belly. What are the signs or symptoms? The main symptom of this condition is a bulge at the belly button or near it. The bulge does not cause pain. Other symptoms depend on the type of hernia you have. A reducible hernia may be seen only when you strain, cough, or lift something heavy. Other symptoms may include: Dull pain. A feeling of pressure. An incarcerated hernia may cause very bad pain. Also, you may: Vomit or feel like you may vomit. Not be able to pass gas. A strangulated hernia may cause: Pain that gets worse and worse. Vomiting, or feeling like you may vomit. Pain when you press on the hernia. Change of color on the skin over the hernia. The skin may become red or purple. Trouble pooping. Blood in the poop. How is this diagnosed? This condition may be diagnosed based on: Your symptoms and medical history. A physical exam. You may be asked to cough or strain while standing. These actions will put pressure inside your belly. The pressure can force the hernia through the opening in your muscles. Your health care provider may try to push the hernia back into your belly (reduce). How is this treated? Surgery is the only treatment for an umbilical hernia. Surgery for a  strangulated hernia must be done right away. If you have a small hernia that's not incarcerated, you may need to lose weight before the surgery is done. Follow these instructions at home: Managing constipation You may need to take these actions to prevent trouble pooping. This will help to prevent straining. Drink enough fluid to keep your pee (urine) pale yellow. Take over-the-counter or prescription medicines. Eat foods that are high in fiber, such as beans, whole grains, and fresh  fruits and vegetables. Limit foods that are high in fat and sugars, such as fried or sweet foods. General instructions Do not try to push the hernia back in. Lose weight, if told by your provider. Watch your hernia for any changes in color or size. Tell your provider if any changes occur. You may need to avoid activities that put pressure on your hernia. You may have to avoid lifting. Ask your provider how much you can safely lift. Take over-the-counter and prescription medicines only as told by your provider. Contact a health care provider if: Your hernia gets larger or feels hard. Your hernia becomes painful. You get a fever or chills. Get help right away if: You get very bad pain near the area of the hernia, and the pain comes on suddenly. You have pain and you vomit or feel like you may vomit. The skin over your hernia changes color. These symptoms may be an emergency. Get help right away. Call 911. Do not wait to see if the symptoms go away. Do not drive yourself to the hospital. This information is not intended to replace advice given to you by your health care provider. Make sure you discuss any questions you have with your health care provider. Document Revised: 10/27/2024 Document Reviewed: 04/07/2023 Elsevier Patient Education  2025 [employer].

## 2025-02-03 NOTE — Progress Notes (Signed)
 " 02/03/2025  Reason for Visit: Incarcerated umbilical hernia  Requesting Provider: Lauraine Buoy, DO  History of Present Illness: Sierra Mitchell is a 54 y.o. female presenting for evaluation of an incarcerated umbilical hernia.  The patient reports noticing this hernia many years ago but has been progressively getting bigger.  She also reports having more discomfort at the umbilicus particular when there is any pushing or pressure on the area.  Denies any nausea vomiting or constipation or diarrhea.  She has not had any recent imaging for this hernia.  She reports that she has gained weight over the years and is trying to get approval for a GLP-1 medication but so far her insurance has denied this.  Denies any other areas of pain or discomfort.  She does have prior abdominal surgery with a laparoscopic converted to open cholecystectomy which was in the 1990s.  She denies any cardiac history or pulmonary history.  Past Medical History: Past Medical History:  Diagnosis Date   Arthritis    History of kidney stones    h/o   Hypertension      Past Surgical History: Past Surgical History:  Procedure Laterality Date   CHOLECYSTECTOMY     CYST EXCISION     shoulder   KNEE ARTHROSCOPY WITH MEDIAL MENISECTOMY Left 03/14/2019   Procedure: KNEE ARTHROSCOPY WITH PATIAL MEDIAL MENISECTOMY  LEFT;  Surgeon: Tobie Priest, MD;  Location: [facility] ORS;  Service: Orthopedics;  Laterality: Left;   PILONIDAL CYST EXCISION     TUMOR EXCISION     left arm    Home Medications: Prior to Admission medications  Medication Sig Start Date End Date Taking? Authorizing Provider  amLODipine  (NORVASC ) 10 MG tablet Take 1 tablet (10 mg total) by mouth daily. 01/06/25  Yes Pardue, Lauraine SAILOR, DO  furosemide  (LASIX ) 20 MG tablet Take 1 tablet (20 mg total) by mouth daily as needed. 11/29/24  Yes Mayers, Cari S, PA-C    Allergies: Allergies[1]  Social History:  reports that she has never smoked. She has never used  smokeless tobacco. She reports current alcohol use. She reports that she does not use drugs.   Family History: Family History  Problem Relation Age of Onset   Cancer Father     Review of Systems: Review of Systems  Constitutional:  Negative for chills and fever.  Respiratory:  Negative for shortness of breath.   Cardiovascular:  Negative for chest pain.  Gastrointestinal:  Positive for abdominal pain. Negative for constipation, nausea and vomiting.  Genitourinary:  Negative for dysuria.    Physical Exam BP (!) 175/75   Pulse (!) 118   Temp 97.8 F (36.6 C) (Oral)   Ht 5' 5 (1.651 m)   Wt (!) 386 lb 3.2 oz (175.2 kg)   LMP 12/26/2024   SpO2 100%   BMI 64.27 kg/m  CONSTITUTIONAL: No acute distress HEENT:  Normocephalic, atraumatic, extraocular motion intact. RESPIRATORY:  Lungs are clear, and breath sounds are equal bilaterally. Normal respiratory effort without pathologic use of accessory muscles. CARDIOVASCULAR: Heart is regular without murmurs, gallops, or rubs. GI: The abdomen is soft, obese, nondistended, with localized tenderness to palpation at the umbilicus when trying to push or reduce her hernia.  The hernia is incarcerated with palpable firmness at the inferior half and softness at the top half.   MUSCULOSKELETAL:  Normal muscle strength and tone in all four extremities.  No peripheral edema or cyanosis. NEUROLOGIC:  Motor and sensation is grossly normal.  Cranial nerves are grossly  intact. PSYCH:  Alert and oriented to person, place and time. Affect is normal.  Laboratory Analysis: Labs from 11/17/2024: Sodium 139, potassium 4.3, chloride 98, BUN 8, creatinine 0.65.  LFTs within normal limits.  WBC 9.5, hemoglobin 14.1, hematocrit 43.5, platelets 395.  Hemoglobin A1c 5.9.  Imaging: No recent images, however patient has a remote CT of abdomen pelvis without contrast on 06/28/2011 which shows on imaging a small 2 cm umbilical hernia containing fat.  Assessment and  Plan: This is a 54 y.o. female with incarcerated umbilical hernia.  - Discussed with patient the findings on exam today showing an incarcerated umbilical hernia.  I suspect that this contains a mixture of fatty tissue, perhaps omentum, and potential bowel.  The patient has not had any obstruction symptoms but she reports having more discomfort at the umbilical area and she is tender on exam when trying to reduce the hernia. - Discussed with patient that we need to further evaluate the contents of the hernia and the size of the hernia and to check for any other potential issues intra-abdominally.  For this, we will obtain a new CT scan of the abdomen and pelvis with contrast. - Discussed with the patient also that her high BMI of 64.27 does make everything more complicated with increased risk of intraoperative complications, hernia recurrence, and wound complications.  In the ideal scenario, we would encourage her to lose weight prior to doing any hernia repair.  However if she is becoming more symptomatic, we might not have that luxury.  I encouraged her for the time being to try to lose weight will we are completing her workup.  Recommended that she could try wearing an abdominal binder to help decrease some of the pressure on the hernia. - Patient will follow-up with me after her CT scan to discuss the results and any possible surgical options.  In the meantime I will contact the anesthesia team to confirm if her BMI would be acceptable for general anesthesia at [facility].  If it is not, then we will need to refer her to a tertiary center for further management.  Even if that is the case, I would still recommend obtaining a CT scan as this will be needed by any surgical team for preoperative consideration and evaluation.  I spent 40 minutes dedicated to the care of this patient on the date of this encounter to include pre-visit review of records, face-to-face time with the patient discussing diagnosis and  management, and any post-visit coordination of care.   Aloysius Sheree Plant, MD [facility]       [1]  Allergies Allergen Reactions   Lisinopril Cough   Valsartan Swelling   "

## 2025-02-08 ENCOUNTER — Other Ambulatory Visit

## 2025-02-08 ENCOUNTER — Ambulatory Visit

## 2025-02-17 ENCOUNTER — Encounter: Admitting: Family Medicine

## 2025-02-20 ENCOUNTER — Ambulatory Visit: Admitting: Surgery
